# Patient Record
Sex: Female | Born: 1950 | Race: White | Hispanic: No | Marital: Married | Smoking: Former smoker
Health system: Southern US, Community
[De-identification: ages and names within clinical notes are randomized; demographics above are authoritative.]

## PROBLEM LIST (undated history)

## (undated) DIAGNOSIS — E785 Hyperlipidemia, unspecified: Secondary | ICD-10-CM

## (undated) DIAGNOSIS — F419 Anxiety disorder, unspecified: Secondary | ICD-10-CM

## (undated) DIAGNOSIS — C4490 Unspecified malignant neoplasm of skin, unspecified: Secondary | ICD-10-CM

## (undated) HISTORY — DX: Anxiety disorder, unspecified: F41.9

## (undated) HISTORY — DX: Hyperlipidemia, unspecified: E78.5

## (undated) HISTORY — PX: BREAST BIOPSY: SHX20

## (undated) HISTORY — DX: Unspecified malignant neoplasm of skin, unspecified: C44.90

## (undated) HISTORY — PX: BREAST EXCISIONAL BIOPSY: SUR124

---

## 1998-01-21 ENCOUNTER — Other Ambulatory Visit: Admission: RE | Admit: 1998-01-21 | Discharge: 1998-01-21 | Payer: Self-pay | Admitting: Obstetrics and Gynecology

## 2000-12-20 ENCOUNTER — Other Ambulatory Visit: Admission: RE | Admit: 2000-12-20 | Discharge: 2000-12-20 | Payer: Self-pay | Admitting: Obstetrics and Gynecology

## 2003-02-28 ENCOUNTER — Other Ambulatory Visit: Admission: RE | Admit: 2003-02-28 | Discharge: 2003-02-28 | Payer: Self-pay | Admitting: Obstetrics and Gynecology

## 2004-06-10 ENCOUNTER — Other Ambulatory Visit: Admission: RE | Admit: 2004-06-10 | Discharge: 2004-06-10 | Payer: Self-pay | Admitting: Obstetrics and Gynecology

## 2004-09-23 ENCOUNTER — Encounter: Admission: RE | Admit: 2004-09-23 | Discharge: 2004-09-23 | Payer: Self-pay | Admitting: Obstetrics and Gynecology

## 2004-10-11 ENCOUNTER — Emergency Department (HOSPITAL_COMMUNITY): Admission: EM | Admit: 2004-10-11 | Discharge: 2004-10-11 | Payer: Self-pay | Admitting: Emergency Medicine

## 2005-06-24 ENCOUNTER — Other Ambulatory Visit: Admission: RE | Admit: 2005-06-24 | Discharge: 2005-06-24 | Payer: Self-pay | Admitting: Obstetrics and Gynecology

## 2007-05-17 HISTORY — PX: COLONOSCOPY: SHX174

## 2008-06-16 LAB — CONVERTED CEMR LAB

## 2009-01-27 ENCOUNTER — Ambulatory Visit: Payer: Self-pay | Admitting: Family Medicine

## 2009-01-27 DIAGNOSIS — F411 Generalized anxiety disorder: Secondary | ICD-10-CM | POA: Insufficient documentation

## 2009-01-29 LAB — CONVERTED CEMR LAB
ALT: 21 units/L (ref 0–35)
AST: 21 units/L (ref 0–37)
Cholesterol: 218 mg/dL — ABNORMAL HIGH (ref 0–200)
Direct LDL: 143.2 mg/dL
HDL: 66.3 mg/dL (ref >39.00)
Total CHOL/HDL Ratio: 3
Triglycerides: 92 mg/dL (ref 0.0–149.0)
VLDL: 18.4 mg/dL (ref 0.0–40.0)

## 2009-03-02 ENCOUNTER — Ambulatory Visit: Payer: Self-pay | Admitting: Psychology

## 2009-08-25 ENCOUNTER — Other Ambulatory Visit: Admission: RE | Admit: 2009-08-25 | Discharge: 2009-08-25 | Payer: Self-pay | Admitting: Family Medicine

## 2009-08-25 ENCOUNTER — Ambulatory Visit: Payer: Self-pay | Admitting: Family Medicine

## 2009-08-25 DIAGNOSIS — E785 Hyperlipidemia, unspecified: Secondary | ICD-10-CM | POA: Insufficient documentation

## 2009-08-26 ENCOUNTER — Encounter: Admission: RE | Admit: 2009-08-26 | Discharge: 2009-08-26 | Payer: Self-pay | Admitting: Family Medicine

## 2009-08-26 LAB — HM MAMMOGRAPHY

## 2009-08-31 ENCOUNTER — Encounter (INDEPENDENT_AMBULATORY_CARE_PROVIDER_SITE_OTHER): Payer: Self-pay | Admitting: *Deleted

## 2009-09-03 ENCOUNTER — Encounter (INDEPENDENT_AMBULATORY_CARE_PROVIDER_SITE_OTHER): Payer: Self-pay | Admitting: *Deleted

## 2009-09-03 LAB — CONVERTED CEMR LAB: Pap Smear: NEGATIVE

## 2009-10-26 ENCOUNTER — Ambulatory Visit: Payer: Self-pay | Admitting: Family Medicine

## 2009-10-27 LAB — CONVERTED CEMR LAB
ALT: 20 units/L (ref 0–35)
AST: 21 units/L (ref 0–37)
Albumin: 4 g/dL (ref 3.5–5.2)
Alkaline Phosphatase: 54 units/L (ref 39–117)
BUN: 18 mg/dL (ref 6–23)
Basophils Absolute: 0.1 10*3/uL (ref 0.0–0.1)
Basophils Relative: 1 % (ref 0.0–3.0)
Bilirubin, Direct: 0.1 mg/dL (ref 0.0–0.3)
CO2: 30 meq/L (ref 19–32)
Calcium: 9.1 mg/dL (ref 8.4–10.5)
Chloride: 107 meq/L (ref 96–112)
Cholesterol: 204 mg/dL — ABNORMAL HIGH (ref 0–200)
Creatinine, Ser: 1 mg/dL (ref 0.4–1.2)
Direct LDL: 119.6 mg/dL
Eosinophils Absolute: 0.1 10*3/uL (ref 0.0–0.7)
Eosinophils Relative: 2.3 % (ref 0.0–5.0)
GFR calc non Af Amer: 61.81 mL/min (ref >60)
Glucose, Bld: 86 mg/dL (ref 70–99)
HCT: 38.8 % (ref 36.0–46.0)
HDL: 75.1 mg/dL (ref >39.00)
Hemoglobin: 13.6 g/dL (ref 12.0–15.0)
Lymphocytes Relative: 27.3 % (ref 12.0–46.0)
Lymphs Abs: 1.4 10*3/uL (ref 0.7–4.0)
MCHC: 34.9 g/dL (ref 30.0–36.0)
MCV: 93.8 fL (ref 78.0–100.0)
Monocytes Absolute: 0.4 10*3/uL (ref 0.1–1.0)
Monocytes Relative: 8 % (ref 3.0–12.0)
Neutro Abs: 3.1 10*3/uL (ref 1.4–7.7)
Neutrophils Relative %: 61.4 % (ref 43.0–77.0)
Platelets: 234 10*3/uL (ref 150.0–400.0)
Potassium: 4.6 meq/L (ref 3.5–5.1)
RBC: 4.14 M/uL (ref 3.87–5.11)
RDW: 13.2 % (ref 11.5–14.6)
Sodium: 143 meq/L (ref 135–145)
TSH: 3.61 microintl units/mL (ref 0.35–5.50)
Total Bilirubin: 0.6 mg/dL (ref 0.3–1.2)
Total CHOL/HDL Ratio: 3
Total Protein: 6.4 g/dL (ref 6.0–8.3)
Triglycerides: 70 mg/dL (ref 0.0–149.0)
VLDL: 14 mg/dL (ref 0.0–40.0)
WBC: 5.1 10*3/uL (ref 4.5–10.5)

## 2010-06-17 NOTE — Letter (Signed)
Summary: Results Follow up Letter  [place] at [facility]  [address] [place], [place] [address]   Phone: [phone]  Fax: [phone]    09/03/2009 MRN: [id number]    Caitlin Perez [address] [place], [place]  [address]    Dear Ms. Buswell,  The following are the results of your recent test(s):  Test         Result    Pap Smear:        Normal ___X__  Not Normal _____ Comments: ______________________________________________________ Cholesterol: LDL(Bad cholesterol):         Your goal is less than:         HDL (Good cholesterol):       Your goal is more than: Comments:  ______________________________________________________ Mammogram:        Normal _____  Not Normal _____ Comments:  ___________________________________________________________________ Hemoccult:        Normal _____  Not normal _______ Comments:    _____________________________________________________________________ Other Tests:    We routinely do not discuss normal results over the telephone.  If you desire a copy of the results, or you have any questions about this information we can discuss them at your next office visit.   Sincerely,    Marne A. Milinda Antis, M.D.  MAT:lsf

## 2010-06-17 NOTE — Assessment & Plan Note (Signed)
Summary: NEW PT TO EST/CLE   Vital Signs:  Patient profile:   60 year old female Height:      67.5 inches Weight:      168.50 pounds BMI:     26.10 Temp:     97.9 degrees F oral Pulse rate:   72 / minute Pulse rhythm:   regular BP sitting:   124 / 80  (left arm) Cuff size:   regular  Vitals Entered By: Liane Comber CMA Duncan Dull) (January 27, 2009 11:10 AM)  History of Present Illness: here to establish has always gone to gyn  has not had a primary care Dr - and wants to get est  is very healthy overall   is healthy eater -- very balanced  does exercise -- about 5 hours per week - walking and kayak  remote former smoker- no lung disease   is due for mammogram- wants to check that  fibrocystic - not as bad with age - does have mod caffiene intake  is interested in screening cholesterol  last checked several years ago - was ok  has not eaten today  does eat a fair amt of hamburger    is on paxil --for anxiety  " I was born anxious" all her life - low dose paxil really helps   had heel dexa in past - was good  takes ca and vit D -- one pill per day    Preventive Screening-Counseling & Management  Alcohol-Tobacco     Smoking Status: quit  Caffeine-Diet-Exercise     Does Patient Exercise: yes      Drug Use:  no.    Allergies (verified): 1)  ! Codeine  Past History:  Past Medical History: anxiety  gyn- Dr Rana Snare   Past Surgical History: colonoscopy 2009 2005 - heel bone density nl   Family History: Family History Ovarian cancer (other relative)- Maunt  Family History Heart disease (grandparent, other relative) family History Stroke (grandparent) Family History Brain Ca (parent)- smoker  grandparents - heart dz  sister with depression/ anx   Social History: Occupation: Airline pilot, Secretary/administrator firm for 14 years  Former Smoker-- quit 20 years ago (smoked for about 20year) Alcohol use-yes-- 4 drinks a week  Drug use-no Regular  exercise-yes moderate caffiene  Occupation:  employed Smoking Status:  quit Drug Use:  no Does Patient Exercise:  yes  Physical Exam  General:  Well-developed,well-nourished,in no acute distress; alert,appropriate and cooperative throughout examination Head:  normocephalic, atraumatic, and no abnormalities observed.   Eyes:  vision grossly intact, pupils equal, pupils round, and pupils reactive to light.  no conjunctival pallor, injection or icterus  Ears:  R ear normal and L ear normal.   Nose:  no nasal discharge.   Mouth:  pharynx pink and moist.   Neck:  supple with full rom and no masses or thyromegally, no JVD or carotid bruit  Lungs:  Normal respiratory effort, chest expands symmetrically. Lungs are clear to auscultation, no crackles or wheezes. Heart:  Normal rate and regular rhythm. S1 and S2 normal without gallop, murmur, click, rub or other extra sounds. Abdomen:  soft, non-tender, normal bowel sounds, no distention, and no masses.  no renal bruits  Msk:  No deformity or scoliosis noted of thoracic or lumbar spine.   Pulses:  R and L carotid,radial,femoral,dorsalis pedis and posterior tibial pulses are full and equal bilaterally Extremities:  No clubbing, cyanosis, edema, or deformity noted with normal full range of motion of all joints.  Neurologic:  sensation intact to light touch, gait normal, and DTRs symmetrical and normal.   Skin:  Intact without suspicious lesions or rashes Cervical Nodes:  No lymphadenopathy noted Psych:  normal affect, talkative and pleasant    Impression & Recommendations:  Problem # 1:  GENERALIZED ANXIETY DISORDER (ICD-300.02) Assessment New doing well on paxil will continue this dose  Her updated medication list for this problem includes:    Paxil 20 Mg Tabs (Paroxetine hcl) .Marland Kitchen... 1/2 by mouth once daily  Problem # 2:  SCREENING FOR LIPOID DISORDERS (ICD-V77.91) Assessment: New lab today disc lowering sat fats in diet   Orders:  Venipuncture ([address]) TLB-Lipid Panel (80061-LIPID) TLB-ALT (SGPT) (84460-ALT) TLB-AST (SGOT) (84450-SGOT)  Problem # 3:  Preventive Health Care (ICD-V70.0) Assessment: Comment Only f/u march for PE  Complete Medication List: 1)  Paxil 20 Mg Tabs (Paroxetine hcl) .... 1/2 by mouth once daily 2)  Calcium-vitamin D 600-125 Mg-unit Tabs (Calcium-vitamin d) .... By mouth daily  Patient Instructions: 1)  the current recommendation for calcium intake is 1200-1500 mg daily with 1000 IU of vitamin D 2)  you can raise your HDL (good cholesterol) by increasing exercise and eating omega 3 fatty acid supplement like fish oil or flax seed oil over the counter 3)  you can lower LDL (bad cholesterol) by limiting saturated fats in diet like red meat, fried foods, egg yolks, fatty breakfast meats, high fat dairy products and shellfish  4)  schedule follow up for PE in march   Prior Medications (reviewed today): CALCIUM-VITAMIN D 600-125 MG-UNIT TABS (CALCIUM-VITAMIN D) by mouth daily Current Allergies (reviewed today): ! CODEINE Current Medications (including changes made in today's visit):  PAXIL 20 MG TABS (PAROXETINE HCL) 1/2 by mouth once daily CALCIUM-VITAMIN D 600-125 MG-UNIT TABS (CALCIUM-VITAMIN D) by mouth daily

## 2010-06-17 NOTE — Letter (Signed)
Summary: Dr.Alixis Harmon,[facility]  Dr.Kay Shippy,[facility]   Imported By: Beau Fanny 01/27/2009 16:20:05  _____________________________________________________________________  External Attachment:    Type:   Image     Comment:   External Document

## 2010-06-17 NOTE — Letter (Signed)
Summary: Results Follow up Letter  [place] at [facility]  [address] [place], [place] [address]   Phone: [phone]  Fax: [phone]    08/31/2009 MRN: [id number]     Caitlin Perez [address] [place], [place]  [address]    Dear Ms. Ramberg,  The following are the results of your recent test(s):  Test         Result    Pap Smear:        Normal _____  Not Normal _____ Comments: ______________________________________________________ Cholesterol: LDL(Bad cholesterol):         Your goal is less than:         HDL (Good cholesterol):       Your goal is more than: Comments:  ______________________________________________________ Mammogram:        Normal ___x__  Not Normal _____ Comments:Repeat in 1 year  ___________________________________________________________________ Hemoccult:        Normal _____  Not normal _______ Comments:    _____________________________________________________________________ Other Tests:    We routinely do not discuss normal results over the telephone.  If you desire a copy of the results, or you have any questions about this information we can discuss them at your next office visit.   Sincerely,  Roxy Manns MD

## 2010-06-17 NOTE — Assessment & Plan Note (Signed)
Summary: CPX / LFW  R/S FROM 08/31/09   Vital Signs:  Patient profile:   60 year old female Height:      67.5 inches Weight:      168.25 pounds BMI:     26.06 Temp:     98 degrees F oral Pulse rate:   76 / minute Pulse rhythm:   regular BP sitting:   136 / 84  (left arm) Cuff size:   regular  Vitals Entered By: Lewanda Rife LPN (August 25, 2009 9:39 AM)  Serial Vital Signs/Assessments:  Time      Position  BP       Pulse  Resp  Temp     By                     129/82                         Judith Part MD  CC: complete physical with pap and breast exam LMP 25 yrs ago   History of Present Illness: here for health mt exam with  gyn care  is feeling fine in general  tax season is almost over- very stressful then will take a 3 day trip to beach    wt is stable   bp 136/84 on first check today- a little high for her -- thinks this is situational  gave blood not long ago -- did well with that   colonosc 09 -- no polyps- 10 year f/u   heel dexa nl in 05  lipids this fall a bit high HDL 66 and LDL 143  asked to work on diet   no fam hx of OP  is taking ca and vit D   pap 2/10--no abn paps or problems  no bleeding  Dr Rana Snare was her gyn  mam 2/09-- needs to get that scheduled  self exam -- no lumps or changes   TD 07    Allergies: 1)  ! Codeine  Past History:  Past Medical History: Last updated: 01/27/2009 anxiety  gyn- Dr Rana Snare   Past Surgical History: Last updated: 01/27/2009 colonoscopy 2009 2005 - heel bone density nl   Family History: Last updated: 01/27/2009 Family History Ovarian cancer (other relative)- Maunt  Family History Heart disease (grandparent, other relative) family History Stroke (grandparent) Family History Brain Ca (parent)- smoker  grandparents - heart dz  sister with depression/ anx   Social History: Last updated: 01/27/2009 Occupation: Airline pilot, [employer] for 14 years  Former Smoker-- quit 20 years ago  (smoked for about 20year) Alcohol use-yes-- 4 drinks a week  Drug use-no Regular exercise-yes moderate caffiene   Risk Factors: Exercise: yes (01/27/2009)  Risk Factors: Smoking Status: quit (01/27/2009)  Review of Systems General:  Denies fatigue, loss of appetite, and malaise. Eyes:  Denies blurring and eye irritation. CV:  Denies chest pain or discomfort, lightheadness, and palpitations. Resp:  Denies cough and wheezing. GI:  Denies abdominal pain, change in bowel habits, and indigestion. GU:  Denies abnormal vaginal bleeding, discharge, and hematuria. MS:  Denies joint pain, joint redness, and joint swelling. Derm:  Denies itching, lesion(s), poor wound healing, and rash. Neuro:  Denies numbness and tingling. Psych:  Denies anxiety and depression. Endo:  Denies cold intolerance, excessive thirst, excessive urination, and heat intolerance. Heme:  Denies abnormal bruising and bleeding.  Physical Exam  General:  Well-developed,well-nourished,in no acute distress; alert,appropriate and cooperative throughout examination Head:  normocephalic, atraumatic, and no abnormalities observed.   Eyes:  vision grossly intact, pupils equal, pupils round, and pupils reactive to light.  no conjunctival pallor, injection or icterus  Ears:  R ear normal and L ear normal.   Nose:  no nasal discharge.   Mouth:  pharynx pink and moist.   Neck:  supple with full rom and no masses or thyromegally, no JVD or carotid bruit  Chest Wall:  No deformities, masses, or tenderness noted. Breasts:  No mass, nodules, thickening, tenderness, bulging, retraction, inflamation, nipple discharge or skin changes noted.   Lungs:  Normal respiratory effort, chest expands symmetrically. Lungs are clear to auscultation, no crackles or wheezes. Heart:  Normal rate and regular rhythm. S1 and S2 normal without gallop, murmur, click, rub or other extra sounds. Abdomen:  Bowel sounds positive,abdomen soft and non-tender  without masses, organomegaly or hernias noted. no renal bruits  Genitalia:  small/ tight introitus with some pain on spec exam  difficult to get good cervical sample due to this  no external lesions, no vaginal discharge, mucosa pink and moist, no vaginal or cervical lesions, no vaginal atrophy, and no friaility or hemorrhage.   Msk:  No deformity or scoliosis noted of thoracic or lumbar spine.  no acute joint changes  Pulses:  R and L carotid,radial,femoral,dorsalis pedis and posterior tibial pulses are full and equal bilaterally Extremities:  No clubbing, cyanosis, edema, or deformity noted with normal full range of motion of all joints.   Neurologic:  sensation intact to light touch, gait normal, and DTRs symmetrical and normal.   Skin:  Intact without suspicious lesions or rashes lentigos diffusely  Cervical Nodes:  No lymphadenopathy noted Axillary Nodes:  No palpable lymphadenopathy Inguinal Nodes:  No significant adenopathy Psych:  normal affect, talkative and pleasant    Impression & Recommendations:  Problem # 1:  HEALTH MAINTENANCE EXAM (ICD-V70.0) Assessment Comment Only reviewed health habits including diet, exercise and skin cancer prevention reviewed health maintenance list and family history reminded to work on chol sched fasting lab in 1-2 mo  rev req for ca and D and exercise for bone health will consider dexa at 65 - no addn risk factors   Problem # 2:  ROUTINE GYNECOLOGICAL EXAMINATION (ICD-V72.31) Assessment: Comment Only annual exam with pap done  somewhat difficult exam with small introitus and some discomfort from that- pt states is baseline  Problem # 3:  OTHER SCREENING MAMMOGRAM (ICD-V76.12) Assessment: Comment Only annual mammogram scheduled adv pt to continue regular self breast exams non remarkable breast exam today  Orders: Radiology Referral (Radiology)  Problem # 4:  HYPERLIPIDEMIA (ICD-272.4) Assessment: New  rev last labs admittedly - pt  has not changed diet at all and wants some more time before re check  will re check 2-3 mo  rev low sat fat diet in detail also urged to get back to exercise   Labs Reviewed: SGOT: 21 (01/27/2009)   SGPT: 21 (01/27/2009)   HDL:66.30 (01/27/2009)  Chol:218 (01/27/2009)  Trig:92.0 (01/27/2009)  Problem # 5:  GENERALIZED ANXIETY DISORDER (ICD-300.02) Assessment: Unchanged doing well with paxil- no probems  handling situational stress well  paxil refilled and sent to pharmacy Her updated medication list for this problem includes:    Paxil 20 Mg Tabs (Paroxetine hcl) .Marland Kitchen... 1/2 by mouth once daily  Complete Medication List: 1)  Paxil 20 Mg Tabs (Paroxetine hcl) .... 1/2 by mouth once daily 2)  Calcium-vitamin D 600-125 Mg-unit Tabs (Calcium-vitamin d) .... By mouth  daily  Patient Instructions: 1)  the current recommendation for calcium intake is 1200-1500 mg daily with 1000 IU of vitamin D  2)  you can raise your HDL (good cholesterol) by increasing exercise and eating omega 3 fatty acid supplement like fish oil or flax seed oil over the counter 3)  you can lower LDL (bad cholesterol) by limiting saturated fats in diet like red meat, fried foods, egg yolks, fatty breakfast meats, high fat dairy products and shellfish  4)  schedule fasting labs in 1-2 months wellness/ lipids 272, v70.0 5)  keep working on low fat diet 6)  we will schedule mammogram at check out  Prescriptions: PAXIL 20 MG TABS (PAROXETINE HCL) 1/2 by mouth once daily  #15 x 11   Entered and Authorized by:   Judith Part MD   Signed by:   Judith Part MD on 08/25/2009   Method used:   Electronically to        [id number] (retail)       [address] [place], [place]  [address]       Ph: [phone]       Fax: [phone]   RxID:   [id number]   Current Allergies (reviewed today): ! CODEINE

## 2010-08-26 ENCOUNTER — Other Ambulatory Visit: Payer: Self-pay | Admitting: *Deleted

## 2010-08-26 MED ORDER — PAROXETINE HCL 20 MG PO TABS: ORAL_TABLET | ORAL | Status: DC

## 2010-11-08 ENCOUNTER — Other Ambulatory Visit: Payer: Self-pay | Admitting: Family Medicine

## 2011-01-12 ENCOUNTER — Other Ambulatory Visit: Payer: Self-pay | Admitting: Family Medicine

## 2011-01-12 NOTE — Telephone Encounter (Signed)
Left message with pt's husband for pt to call back tomorrow.

## 2011-01-12 NOTE — Telephone Encounter (Signed)
[facility] electronically request refill Paxil 20mg . Pt last seen 08/25/09. Unable to reach pt by phone to schedule f/u appt.Please advise.

## 2011-01-12 NOTE — Telephone Encounter (Signed)
I will refil once electronically - but no further until appt is made Send a letter please since you cannot reach her -thanks

## 2011-01-14 ENCOUNTER — Encounter: Payer: Self-pay | Admitting: Family Medicine

## 2011-01-14 NOTE — Telephone Encounter (Signed)
Patient notified as instructed by telephone. Pt scheduled appt with Dr Milinda Antis 01/18/11 at 8:00am.

## 2011-01-18 ENCOUNTER — Ambulatory Visit (INDEPENDENT_AMBULATORY_CARE_PROVIDER_SITE_OTHER): Payer: Self-pay | Admitting: Family Medicine

## 2011-01-18 ENCOUNTER — Encounter: Payer: Self-pay | Admitting: Family Medicine

## 2011-01-18 DIAGNOSIS — F411 Generalized anxiety disorder: Secondary | ICD-10-CM

## 2011-01-18 DIAGNOSIS — E785 Hyperlipidemia, unspecified: Secondary | ICD-10-CM

## 2011-01-18 MED ORDER — PAROXETINE HCL 20 MG PO TABS: 10.0000 mg | ORAL_TABLET | ORAL | Status: DC

## 2011-01-18 NOTE — Assessment & Plan Note (Signed)
This was improved last year with health diet and exercise  Is doing well with this overall and does not want to check this year Rev last labs with her  Rev low sat fat diet

## 2011-01-18 NOTE — Progress Notes (Signed)
Subjective:    Patient ID: Caitlin Perez, female    DOB: Sep 17, 1950, 60 y.o.   MRN: [id number]  HPI Here for f/u of chronic med problems incl anxiety and hyperlipidemia  Is doing well overall but has a little cold or allergies Just came back from [place]  Started with sore throat  Nasal d/c is clear - watery   Will try antihistamine  Wt is down 2 lb- working out and watching what she eats  Lost 4 lb by her scale   Last lipids were fair Lab Results  Component Value Date   CHOL 204* 10/26/2009   CHOL 218* 01/27/2009   Lab Results  Component Value Date   HDL 75.10 10/26/2009   HDL 65.78 01/27/2009   No results found for this basename: [facility]   Lab Results  Component Value Date   TRIG 70.0 10/26/2009   TRIG 92.0 01/27/2009   Lab Results  Component Value Date   CHOLHDL 3 10/26/2009   CHOLHDL 3 01/27/2009   Lab Results  Component Value Date   LDLDIRECT 119.6 10/26/2009   LDLDIRECT 143.2 01/27/2009   Diet - is much better - and does not want to check it due to improvement with it  No sweets and no red meat   On paxil for anxiety- is doing great and is excited about that  Has missed some doses - out of town  Can feel a bit dizzy and jittery off of it  Takes 1/2 pill daily for total dose of 10 mg and this works well for her  No depressive symptoms This helps keep her level in terms of anxiety and stress especially during tax season   Patient Active Problem List  Diagnoses  . HYPERLIPIDEMIA  . GENERALIZED ANXIETY DISORDER   Past Medical History  Diagnosis Date  . Anxiety   . Other and unspecified hyperlipidemia    Past Surgical History  Procedure Date  . Colonoscopy 2009   History  Substance Use Topics  . Smoking status: Former Games developer  . Smokeless tobacco: Not on file  . Alcohol Use: Yes     4 drinks/week   Family History  Problem Relation Age of Onset  . Ovarian cancer Maternal Aunt   . Heart disease      GP  . Stroke      GP  . Brain cancer     Parent  +smoker  . Depression Sister   . Anxiety disorder Sister    Allergies  Allergen Reactions  . Codeine    Current Outpatient Prescriptions on File Prior to Visit  Medication Sig Dispense Refill  . Calcium-Vitamin D 600-125 MG-UNIT TABS Take 1 tablet by mouth daily.              Review of Systems Review of Systems  Constitutional: Negative for fever, appetite change, fatigue and unexpected weight change.  Eyes: Negative for pain and visual disturbance.  ENT pos for runny nose, no facial pain or ear pain  Respiratory: Negative for cough and shortness of breath.   Cardiovascular: Negative for cp or palpitations    Gastrointestinal: Negative for nausea, diarrhea and constipation.  Genitourinary: Negative for urgency and frequency.  Skin: Negative for pallor or rash   Neurological: Negative for weakness, light-headedness, numbness and headaches.  Hematological: Negative for adenopathy. Does not bruise/bleed easily.  Psychiatric/Behavioral: Negative for dysphoric mood. Anxiety is improved         Objective:   Physical Exam  Constitutional: She appears well-developed and  well-nourished. No distress.  HENT:  Head: Normocephalic and atraumatic.  Mouth/Throat: Oropharynx is clear and moist.  Eyes: Conjunctivae and EOM are normal. Pupils are equal, round, and reactive to light.  Neck: Normal range of motion. Neck supple. No JVD present. No thyromegaly present.  Cardiovascular: Normal rate, regular rhythm, normal heart sounds and intact distal pulses.   Pulmonary/Chest: Effort normal and breath sounds normal. No respiratory distress. She has no wheezes.  Abdominal: Soft. Bowel sounds are normal.  Musculoskeletal: She exhibits no tenderness.  Lymphadenopathy:    She has no cervical adenopathy.  Neurological: She is alert. She has normal reflexes. No cranial nerve deficit. Coordination normal.  Skin: Skin is warm and dry. No rash noted. No erythema. No pallor.  Psychiatric:  She has a normal mood and affect.          Assessment & Plan:

## 2011-01-18 NOTE — Patient Instructions (Signed)
I'm glad you are doing well Keep up the great diet and exercise  No change in paxil- I sent it to your pharmacy

## 2011-01-18 NOTE — Assessment & Plan Note (Signed)
Overall doing well with low dose paxil- no problems  Refilled this for the year Commended on good diet and exercise and coping skills Will update if any problems

## 2011-02-19 ENCOUNTER — Other Ambulatory Visit: Payer: Self-pay | Admitting: Family Medicine

## 2012-01-13 ENCOUNTER — Ambulatory Visit: Payer: BC Managed Care – PPO | Admitting: Family Medicine

## 2012-02-06 ENCOUNTER — Other Ambulatory Visit: Payer: Self-pay | Admitting: *Deleted

## 2012-02-06 NOTE — Telephone Encounter (Signed)
Please call and make a fall or winter appt and can refil med until then thanks

## 2012-02-06 NOTE — Telephone Encounter (Signed)
Ok to refill? Last OV 01/18/11, no recent labs and no future appt.

## 2012-02-07 NOTE — Telephone Encounter (Signed)
Pt home # off so call work # and left pt a voicemail requesting her to call the office

## 2012-02-09 MED ORDER — PAROXETINE HCL 20 MG PO TABS: 10.0000 mg | ORAL_TABLET | ORAL | Status: DC

## 2012-02-09 NOTE — Telephone Encounter (Signed)
Go ahead and tell her to schedule fasting labs prior, thanks

## 2012-02-09 NOTE — Telephone Encounter (Signed)
Pt schedule f/u appt for 02/17/12 @ 8:45, pt wanted to know if she needs to be fasting for lab work or is it ok to eat before the appt?

## 2012-02-09 NOTE — Telephone Encounter (Signed)
Left voicemail letting pt know she needs to schedule a fasting lab appt. Before her appt. With D. T

## 2012-02-13 ENCOUNTER — Telehealth (INDEPENDENT_AMBULATORY_CARE_PROVIDER_SITE_OTHER): Payer: BC Managed Care – PPO | Admitting: Family Medicine

## 2012-02-13 DIAGNOSIS — E785 Hyperlipidemia, unspecified: Secondary | ICD-10-CM

## 2012-02-13 DIAGNOSIS — F411 Generalized anxiety disorder: Secondary | ICD-10-CM

## 2012-02-13 NOTE — Telephone Encounter (Signed)
Message copied by Judy Pimple on Mon Feb 13, 2012  9:55 PM ------      Message from: Alvina Chou      Created: Mon Feb 13, 2012 10:39 AM      Regarding: Lab orders for Tuesday 10.1.13       Labs for f/u appt, thanks, t

## 2012-02-14 ENCOUNTER — Other Ambulatory Visit (INDEPENDENT_AMBULATORY_CARE_PROVIDER_SITE_OTHER): Payer: BC Managed Care – PPO

## 2012-02-14 DIAGNOSIS — E785 Hyperlipidemia, unspecified: Secondary | ICD-10-CM

## 2012-02-14 DIAGNOSIS — F411 Generalized anxiety disorder: Secondary | ICD-10-CM

## 2012-02-14 LAB — LIPID PANEL
Cholesterol: 187 mg/dL (ref 0–200)
HDL: 70.8 mg/dL (ref >39.00)
LDL Cholesterol: 104 mg/dL — ABNORMAL HIGH (ref 0–99)
Total CHOL/HDL Ratio: 3
Triglycerides: 60 mg/dL (ref 0.0–149.0)
VLDL: 12 mg/dL (ref 0.0–40.0)

## 2012-02-14 LAB — COMPREHENSIVE METABOLIC PANEL
ALT: 22 U/L (ref 0–35)
AST: 21 U/L (ref 0–37)
Albumin: 3.9 g/dL (ref 3.5–5.2)
Alkaline Phosphatase: 51 U/L (ref 39–117)
BUN: 16 mg/dL (ref 6–23)
CO2: 28 mEq/L (ref 19–32)
Calcium: 9.1 mg/dL (ref 8.4–10.5)
Chloride: 104 mEq/L (ref 96–112)
Creatinine, Ser: 1 mg/dL (ref 0.4–1.2)
GFR: 57.27 mL/min — ABNORMAL LOW (ref >60.00)
Glucose, Bld: 90 mg/dL (ref 70–99)
Potassium: 4.1 mEq/L (ref 3.5–5.1)
Sodium: 138 mEq/L (ref 135–145)
Total Bilirubin: 0.9 mg/dL (ref 0.3–1.2)
Total Protein: 6.6 g/dL (ref 6.0–8.3)

## 2012-02-14 LAB — TSH: TSH: 3.7 u[IU]/mL (ref 0.35–5.50)

## 2012-02-17 ENCOUNTER — Ambulatory Visit: Payer: BC Managed Care – PPO | Admitting: Family Medicine

## 2012-02-28 ENCOUNTER — Encounter: Payer: Self-pay | Admitting: Family Medicine

## 2012-02-28 ENCOUNTER — Ambulatory Visit (INDEPENDENT_AMBULATORY_CARE_PROVIDER_SITE_OTHER): Payer: BC Managed Care – PPO | Admitting: Family Medicine

## 2012-02-28 VITALS — BP 125/80 | HR 68 | Temp 98.5°F | Ht 68.0 in | Wt 168.0 lb

## 2012-02-28 DIAGNOSIS — M25562 Pain in left knee: Secondary | ICD-10-CM | POA: Insufficient documentation

## 2012-02-28 DIAGNOSIS — M25569 Pain in unspecified knee: Secondary | ICD-10-CM

## 2012-02-28 DIAGNOSIS — F411 Generalized anxiety disorder: Secondary | ICD-10-CM

## 2012-02-28 MED ORDER — PAROXETINE HCL 20 MG PO TABS: 10.0000 mg | ORAL_TABLET | ORAL | Status: DC

## 2012-02-28 NOTE — Progress Notes (Signed)
Subjective:    Patient ID: Caitlin Perez, female    DOB: 1951-01-26, 61 y.o.   MRN: [id number]  HPI Here for f/u of chronic condition- anxiety disorder   Also she hurt her knee last week - is training for a 5 K  L knee- is a reoccuring problem  Has happened about 6 times  Was running on the beach- uneven surface  Felt a twinge - and then hurt worse after  Even had trouble as a kid -- tore a ligament while sledding - no surgery  Went to [facility] doctor - several years ago  Tried some ice last night  Is using nsaid at home  Is is improved from last week  Never swelled  Wears a brace   Anxiety is very well controlled , and less stress lately  On paxil for 1 years - plans to stay on it No side effects  Mood is good  A lot of anxiety in her family   Wt is up 2 lb with bmi of 21   Does not get flu shots  She declines it here or at work  [employer] give me a good reason why   bp is up a bit today   Patient Active Problem List  Diagnosis  . HYPERLIPIDEMIA  . GENERALIZED ANXIETY DISORDER  . Knee pain, left   Past Medical History  Diagnosis Date  . Anxiety   . Other and unspecified hyperlipidemia    Past Surgical History  Procedure Date  . Colonoscopy 2009   History  Substance Use Topics  . Smoking status: Former Games developer  . Smokeless tobacco: Not on file  . Alcohol Use: Yes     4 drinks/week   Family History  Problem Relation Age of Onset  . Ovarian cancer Maternal Aunt   . Heart disease      GP  . Stroke      GP  . Brain cancer      Parent  +smoker  . Depression Sister   . Anxiety disorder Sister    Allergies  Allergen Reactions  . Codeine    Current Outpatient Prescriptions on File Prior to Visit  Medication Sig Dispense Refill  . Calcium-Vitamin D 600-125 MG-UNIT TABS Take 1 tablet by mouth daily.        Marland Kitchen PARoxetine (PAXIL) 20 MG tablet Take 0.5 tablets (10 mg total) by mouth every morning.  15 tablet  11         Review of Systems   Review  of Systems  Constitutional: Negative for fever, appetite change, fatigue and unexpected weight change.  Eyes: Negative for pain and visual disturbance.  Respiratory: Negative for cough and shortness of breath.   Cardiovascular: Negative for cp or palpitations    Gastrointestinal: Negative for nausea, diarrhea and constipation.  Genitourinary: Negative for urgency and frequency.  Skin: Negative for pallor or rash   MSK pos for knee pain  Neurological: Negative for weakness, light-headedness, numbness and headaches.  Hematological: Negative for adenopathy. Does not bruise/bleed easily.  Psychiatric/Behavioral: Negative for dysphoric mood. The patient has anxiety that is fairly controlled     Objective:   Physical Exam  Constitutional: She appears well-developed and well-nourished. No distress.  HENT:  Head: Normocephalic and atraumatic.  Mouth/Throat: Oropharynx is clear and moist.  Eyes: Conjunctivae normal and EOM are normal. Pupils are equal, round, and reactive to light. No scleral icterus.  Neck: Normal range of motion. Neck supple. No JVD present. Carotid bruit is not  present. No thyromegaly present.  Cardiovascular: Normal rate, regular rhythm, normal heart sounds and intact distal pulses.   Pulmonary/Chest: Effort normal and breath sounds normal. No respiratory distress. She has no wheezes.  Abdominal: Soft. Bowel sounds are normal. She exhibits no abdominal bruit.  Musculoskeletal: She exhibits tenderness. She exhibits no edema.       Left knee: She exhibits abnormal patellar mobility. She exhibits normal range of motion, no swelling, no effusion, no ecchymosis, no deformity, no erythema, normal alignment, no LCL laxity, no bony tenderness, normal meniscus and no MCL laxity. tenderness found. Patellar tendon tenderness noted.  Lymphadenopathy:    She has no cervical adenopathy.  Neurological: She is alert. She has normal reflexes.  Skin: Skin is warm and dry. No rash noted. No  erythema. No pallor.  Psychiatric: She has a normal mood and affect.          Assessment & Plan:

## 2012-02-28 NOTE — Patient Instructions (Addendum)
For knee- continue to ice for 10 minute intervals when you can - and elevate Wear brace when active  Consider lower impact exercise -- doing different things  Aleve or advil otc with food - is ok for 1-2 weeks If no further improvement- make appt with Dr Patsy Lager here  No change in paxil  I'm glad you are doing well

## 2012-02-28 NOTE — Assessment & Plan Note (Signed)
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

## 2012-02-28 NOTE — Assessment & Plan Note (Signed)
Doing well - no problems with paxil and has been unable to stop it in the past Will continue to refil Good coping skills and exercise  Pt declines health mt

## 2012-03-07 ENCOUNTER — Other Ambulatory Visit: Payer: Self-pay | Admitting: *Deleted

## 2012-10-09 ENCOUNTER — Other Ambulatory Visit: Payer: Self-pay | Admitting: Family Medicine

## 2012-10-09 MED ORDER — PAROXETINE HCL 20 MG PO TABS: 10.0000 mg | ORAL_TABLET | ORAL | Status: DC

## 2012-10-09 NOTE — Telephone Encounter (Signed)
Received fax from [facility] pharmacy requesting pt's paxil to be a 90 day supply, new Rx sent to pharmacy

## 2012-12-25 ENCOUNTER — Other Ambulatory Visit (HOSPITAL_COMMUNITY)
Admission: RE | Admit: 2012-12-25 | Discharge: 2012-12-25 | Disposition: A | Discharge status: Home / Self Care | Payer: BC Managed Care – PPO | Source: Ambulatory Visit | Attending: Family Medicine | Admitting: Family Medicine

## 2012-12-25 ENCOUNTER — Other Ambulatory Visit: Payer: Self-pay

## 2012-12-25 ENCOUNTER — Ambulatory Visit (INDEPENDENT_AMBULATORY_CARE_PROVIDER_SITE_OTHER): Payer: BC Managed Care – PPO | Admitting: Family Medicine

## 2012-12-25 ENCOUNTER — Encounter: Payer: Self-pay | Admitting: Family Medicine

## 2012-12-25 VITALS — BP 134/74 | HR 69 | Temp 98.4°F | Ht 67.75 in | Wt 166.5 lb

## 2012-12-25 DIAGNOSIS — Z01419 Encounter for gynecological examination (general) (routine) without abnormal findings: Secondary | ICD-10-CM

## 2012-12-25 DIAGNOSIS — Z1151 Encounter for screening for human papillomavirus (HPV): Secondary | ICD-10-CM | POA: Insufficient documentation

## 2012-12-25 DIAGNOSIS — R5383 Other fatigue: Secondary | ICD-10-CM

## 2012-12-25 DIAGNOSIS — R5381 Other malaise: Secondary | ICD-10-CM

## 2012-12-25 DIAGNOSIS — Z0000 Encounter for general adult medical examination without abnormal findings: Secondary | ICD-10-CM

## 2012-12-25 DIAGNOSIS — Z1231 Encounter for screening mammogram for malignant neoplasm of breast: Secondary | ICD-10-CM

## 2012-12-25 LAB — COMPREHENSIVE METABOLIC PANEL
ALT: 20 U/L (ref 0–35)
AST: 20 U/L (ref 0–37)
Albumin: 4.2 g/dL (ref 3.5–5.2)
Alkaline Phosphatase: 55 U/L (ref 39–117)
BUN: 17 mg/dL (ref 6–23)
CO2: 28 mEq/L (ref 19–32)
Calcium: 9.5 mg/dL (ref 8.4–10.5)
Chloride: 103 mEq/L (ref 96–112)
Creatinine, Ser: 1.1 mg/dL (ref 0.4–1.2)
GFR: 54.67 mL/min — ABNORMAL LOW (ref >60.00)
Glucose, Bld: 98 mg/dL (ref 70–99)
Potassium: 4.1 mEq/L (ref 3.5–5.1)
Sodium: 140 mEq/L (ref 135–145)
Total Bilirubin: 0.8 mg/dL (ref 0.3–1.2)
Total Protein: 7.3 g/dL (ref 6.0–8.3)

## 2012-12-25 LAB — TSH: TSH: 2.34 u[IU]/mL (ref 0.35–5.50)

## 2012-12-25 LAB — LIPID PANEL
Cholesterol: 199 mg/dL (ref 0–200)
HDL: 73.4 mg/dL (ref >39.00)
LDL Cholesterol: 109 mg/dL — ABNORMAL HIGH (ref 0–99)
Total CHOL/HDL Ratio: 3
Triglycerides: 84 mg/dL (ref 0.0–149.0)
VLDL: 16.8 mg/dL (ref 0.0–40.0)

## 2012-12-25 LAB — CBC WITH DIFFERENTIAL/PLATELET
Basophils Absolute: 0 10*3/uL (ref 0.0–0.1)
Basophils Relative: 0.6 % (ref 0.0–3.0)
Eosinophils Absolute: 0.1 10*3/uL (ref 0.0–0.7)
Eosinophils Relative: 1.5 % (ref 0.0–5.0)
HCT: 42.7 % (ref 36.0–46.0)
Hemoglobin: 14.4 g/dL (ref 12.0–15.0)
Lymphocytes Relative: 23 % (ref 12.0–46.0)
Lymphs Abs: 1.3 10*3/uL (ref 0.7–4.0)
MCHC: 33.7 g/dL (ref 30.0–36.0)
MCV: 94.8 fl (ref 78.0–100.0)
Monocytes Absolute: 0.4 10*3/uL (ref 0.1–1.0)
Monocytes Relative: 7.7 % (ref 3.0–12.0)
Neutro Abs: 3.7 10*3/uL (ref 1.4–7.7)
Neutrophils Relative %: 67.2 % (ref 43.0–77.0)
Platelets: 242 10*3/uL (ref 150.0–400.0)
RBC: 4.5 Mil/uL (ref 3.87–5.11)
RDW: 12.5 % (ref 11.5–14.6)
WBC: 5.5 10*3/uL (ref 4.5–10.5)

## 2012-12-25 LAB — VITAMIN B12: Vitamin B-12: 384 pg/mL (ref 211–911)

## 2012-12-25 MED ORDER — PAROXETINE HCL 20 MG PO TABS: 20.0000 mg | ORAL_TABLET | ORAL | Status: DC

## 2012-12-25 NOTE — Assessment & Plan Note (Signed)
Lab today along with wellness lab  May be due to lifestyle /schedule/ menopausal change

## 2012-12-25 NOTE — Progress Notes (Signed)
Subjective:    Patient ID: Caitlin Perez, female    DOB: 10/15/1950, 62 y.o.   MRN: [id number]  HPI Here for health maintenance exam and to review chronic medical problems    Is doing well overall - but very very fatigued  She tried some B12 for a while - otc -? Dose - may have helped a little bit  Sleeps a lot - and sleeps fairly well , she does snore a bit -- mother had sleep apnea  She does wake up fairly refreshed - with coffee   Some stress - feels it more than she used to  paxil - takes up to a whole pill daily now    Colon cancer screening - has had one at age 17 with a 10 year recall   Zoster status -never had vaccine and not interested   Mammogram 4/11 -has not had one since- will go to the breast center Nl self exam - no lumps   Pap 4/11-has not had one since No problems  Will do that today  Flu vaccine -does not get them   Td 1/07  Mood - fair-on paxil   Walks about an hour most days -does well with that   Patient Active Problem List   Diagnosis Date Noted  . Routine general medical examination at a health care facility 12/25/2012  . Knee pain, left 02/28/2012  . HYPERLIPIDEMIA 08/25/2009  . GENERALIZED ANXIETY DISORDER 01/27/2009   Past Medical History  Diagnosis Date  . Anxiety   . Other and unspecified hyperlipidemia    Past Surgical History  Procedure Laterality Date  . Colonoscopy  2009   History  Substance Use Topics  . Smoking status: Former Games developer  . Smokeless tobacco: Not on file     Comment: over 30 years ago  . Alcohol Use: Yes     Comment: 4 drinks/week   Family History  Problem Relation Age of Onset  . Ovarian cancer Maternal Aunt   . Heart disease      GP  . Stroke      GP  . Brain cancer      Parent  +smoker  . Depression Sister   . Anxiety disorder Sister    Allergies  Allergen Reactions  . Codeine    Current Outpatient Prescriptions on File Prior to Visit  Medication Sig Dispense Refill  . PARoxetine (PAXIL)  20 MG tablet Take 0.5 tablets (10 mg total) by mouth every morning.  45 tablet  1   No current facility-administered medications on file prior to visit.      Review of Systems Review of Systems  Constitutional: Negative for fever, appetite change,  and unexpected weight change. pos for fatigue Eyes: Negative for pain and visual disturbance.  Respiratory: Negative for cough and shortness of breath.   Cardiovascular: Negative for cp or palpitations    Gastrointestinal: Negative for nausea, diarrhea and constipation.  Genitourinary: Negative for urgency and frequency.  Skin: Negative for pallor or rash   Neurological: Negative for weakness, light-headedness, numbness and headaches.  Hematological: Negative for adenopathy. Does not bruise/bleed easily.  Psychiatric/Behavioral: Negative for dysphoric mood. The patient is not nervous/anxious.         Objective:   Physical Exam  Constitutional: She appears well-developed and well-nourished. No distress.  HENT:  Head: Normocephalic and atraumatic.  Right Ear: External ear normal.  Left Ear: External ear normal.  Nose: Nose normal.  Mouth/Throat: Oropharynx is clear and moist.  Eyes: Conjunctivae and  EOM are normal. Pupils are equal, round, and reactive to light. Right eye exhibits no discharge. Left eye exhibits no discharge. No scleral icterus.  Neck: Normal range of motion. Neck supple. No JVD present. Carotid bruit is not present. No thyromegaly present.  Cardiovascular: Normal rate, regular rhythm, normal heart sounds and intact distal pulses.  Exam reveals no gallop.   Pulmonary/Chest: Effort normal and breath sounds normal. No respiratory distress. She has no wheezes. She has no rales.  Abdominal: Soft. Bowel sounds are normal. She exhibits no distension, no abdominal bruit and no mass. There is no tenderness.  Genitourinary: Rectum normal, vagina normal and uterus normal. No breast swelling, tenderness, discharge or bleeding. There  is no rash, tenderness or lesion on the right labia. There is no rash, tenderness or lesion on the left labia. Uterus is not enlarged and not tender. Cervix exhibits no motion tenderness, no discharge and no friability. Right adnexum displays no mass, no tenderness and no fullness. Left adnexum displays no mass, no tenderness and no fullness. No bleeding around the vagina. No vaginal discharge found.  Very tight introitus  Breast exam: No mass, nodules, thickening, tenderness, bulging, retraction, inflamation, nipple discharge or skin changes noted.  No axillary or clavicular LA.  Chaperoned exam.    Musculoskeletal: She exhibits no edema and no tenderness.  Lymphadenopathy:    She has no cervical adenopathy.  Neurological: She is alert. She has normal reflexes. No cranial nerve deficit. She exhibits normal muscle tone. Coordination normal.  Skin: Skin is warm and dry. No rash noted. No erythema. No pallor.  Psychiatric: She has a normal mood and affect.          Assessment & Plan:

## 2012-12-25 NOTE — Assessment & Plan Note (Signed)
Reviewed health habits including diet and exercise and skin cancer prevention Also reviewed health mt list, fam hx and immunizations  Wellness labs today 

## 2012-12-25 NOTE — Assessment & Plan Note (Signed)
Routine exam with pap  No complaints or issues

## 2012-12-25 NOTE — Patient Instructions (Addendum)
Don't forget to schedule your mammogram  Pap today Labs today  Take care of yourself and keep walking

## 2012-12-27 ENCOUNTER — Encounter: Payer: Self-pay | Admitting: *Deleted

## 2012-12-31 ENCOUNTER — Encounter: Payer: Self-pay | Admitting: *Deleted

## 2013-01-08 ENCOUNTER — Ambulatory Visit
Admission: RE | Admit: 2013-01-08 | Discharge: 2013-01-08 | Disposition: A | Discharge status: Home / Self Care | Payer: BC Managed Care – PPO | Source: Ambulatory Visit

## 2013-01-08 DIAGNOSIS — Z1231 Encounter for screening mammogram for malignant neoplasm of breast: Secondary | ICD-10-CM

## 2013-01-09 ENCOUNTER — Encounter: Payer: Self-pay | Admitting: *Deleted

## 2013-10-17 ENCOUNTER — Ambulatory Visit: Payer: BC Managed Care – PPO | Admitting: Physician Assistant

## 2013-10-17 VITALS — BP 152/70 | HR 78 | Temp 98.2°F | Resp 18 | Ht 68.0 in | Wt 171.0 lb

## 2013-10-17 DIAGNOSIS — L03319 Cellulitis of trunk, unspecified: Secondary | ICD-10-CM

## 2013-10-17 DIAGNOSIS — M25569 Pain in unspecified knee: Secondary | ICD-10-CM

## 2013-10-17 DIAGNOSIS — L02219 Cutaneous abscess of trunk, unspecified: Secondary | ICD-10-CM

## 2013-10-17 DIAGNOSIS — M25561 Pain in right knee: Secondary | ICD-10-CM

## 2013-10-17 DIAGNOSIS — W57XXXA Bitten or stung by nonvenomous insect and other nonvenomous arthropods, initial encounter: Secondary | ICD-10-CM

## 2013-10-17 DIAGNOSIS — T148 Other injury of unspecified body region: Secondary | ICD-10-CM

## 2013-10-17 DIAGNOSIS — L03312 Cellulitis of back [any part except buttock]: Secondary | ICD-10-CM

## 2013-10-17 MED ORDER — DOXYCYCLINE HYCLATE 50 MG PO CAPS: 50.0000 mg | ORAL_CAPSULE | Freq: Two times a day (BID) | ORAL | Status: DC

## 2013-10-17 MED ORDER — MELOXICAM 7.5 MG PO TABS: 7.5000 mg | ORAL_TABLET | Freq: Every day | ORAL | Status: DC

## 2013-10-17 NOTE — Progress Notes (Signed)
   Subjective:    Patient ID: Caitlin Perez, female    DOB: Nov 06, 1950, 63 y.o.   MRN: [id number]  HPI 63 year old female presents for evaluation of possible infection surrounding a tick bite. Pulled tick off her back about 2 week ago.  Is sure she removed entire tick and also is confident it was attached <24 hours.  Since then she has noticed surrounding redness and a scab that won't heal.   No purulent drainage, fever, chills, nausea, vomiting, abdominal pain, or dizziness.   Also concerned about right knee pain. States she has "blown out her knee" several times, and "did it again," about 2 weeks ago. States it is doing much better and seems to be improving - she even used the elliptical last week and felt ok.  Continues to have some pain after completing workouts. Has noticed some swelling but does not have any painful locking or popping. Does not think she has had any ligament tears and has never had an MRI. She does wear a knee brace and take advil which helps. States she needs to start training because she is going on a 70 mile hike in [place] in 1 month.    Review of Systems  Constitutional: Negative for fever and chills.  Gastrointestinal: Negative for nausea, vomiting and abdominal pain.  Musculoskeletal: Positive for arthralgias and joint swelling.  Skin: Positive for rash.  Neurological: Negative for dizziness and headaches.       Objective:   Physical Exam  Constitutional: She is oriented to person, place, and time. She appears well-developed and well-nourished.  HENT:  Head: Normocephalic and atraumatic.  Right Ear: External ear normal.  Left Ear: External ear normal.  Eyes: Conjunctivae are normal.  Neck: Normal range of motion.  Cardiovascular: Normal rate.   Pulmonary/Chest: Effort normal.  Musculoskeletal:       Right knee: She exhibits swelling (medial tibial condyle). She exhibits normal range of motion, no effusion, no ecchymosis, no deformity, no erythema, no  LCL laxity and no MCL laxity. No tenderness found. No medial joint line, no lateral joint line, no MCL, no LCL and no patellar tendon tenderness noted.  Neurological: She is alert and oriented to person, place, and time.  Skin:     Psychiatric: She has a normal mood and affect. Her behavior is normal. Judgment and thought content normal.          Assessment & Plan:  Knee pain, right - Plan: meloxicam (MOBIC) 7.5 MG tablet  Cellulitis of back - Plan: doxycycline (VIBRAMYCIN) 50 MG capsule  Tick bite - Plan: doxycycline (VIBRAMYCIN) 50 MG capsule  Will treat tick bite for early cellulitis/infection with doxycycline 100 mg bid x 10 days RTC precautions discussed Knee pain - trial of mobic 7.5 mg 1-2 times daily with food (d/c ibuprofen).  Ok to start with elliptical training as tolerated. Continue to wear brace and ice for 20 minutes after exercise.  Follow up if symptoms worsen or fail to improve.

## 2013-12-13 ENCOUNTER — Other Ambulatory Visit: Payer: Self-pay | Admitting: Physician Assistant

## 2013-12-13 NOTE — Telephone Encounter (Signed)
Caitlin Perez, at 10/17/13 OV you gave pt a trial of mobic. Do you want to give her RFs?

## 2014-04-29 ENCOUNTER — Telehealth: Payer: Self-pay | Admitting: Family Medicine

## 2014-04-29 NOTE — Telephone Encounter (Signed)
Electronic refill request, please advise  

## 2014-04-29 NOTE — Telephone Encounter (Signed)
Please schedule annual exam and refill until then  

## 2014-04-30 NOTE — Telephone Encounter (Signed)
See prev note, once CPE is scheduled I will refill med

## 2014-04-30 NOTE — Telephone Encounter (Signed)
LVM on cell to c/b and schedule cpe with labs prior.

## 2014-04-30 NOTE — Telephone Encounter (Signed)
Med refilled.

## 2014-04-30 NOTE — Telephone Encounter (Signed)
Pt made appointment  For 09/10/14 wanted labs same day Please close

## 2014-09-03 ENCOUNTER — Other Ambulatory Visit: Payer: Self-pay

## 2014-09-03 DIAGNOSIS — Z1231 Encounter for screening mammogram for malignant neoplasm of breast: Secondary | ICD-10-CM

## 2014-09-04 ENCOUNTER — Ambulatory Visit
Admission: RE | Admit: 2014-09-04 | Discharge: 2014-09-04 | Disposition: A | Discharge status: Home / Self Care | Payer: BLUE CROSS/BLUE SHIELD | Source: Ambulatory Visit

## 2014-09-04 DIAGNOSIS — Z1231 Encounter for screening mammogram for malignant neoplasm of breast: Secondary | ICD-10-CM

## 2014-09-04 LAB — HM MAMMOGRAPHY: HM Mammogram: NORMAL

## 2014-09-08 ENCOUNTER — Encounter: Payer: Self-pay | Admitting: *Deleted

## 2014-09-09 ENCOUNTER — Encounter: Payer: Self-pay | Admitting: Family Medicine

## 2014-09-10 ENCOUNTER — Encounter: Payer: BC Managed Care – PPO | Admitting: Family Medicine

## 2014-09-10 ENCOUNTER — Telehealth: Payer: Self-pay | Admitting: Family Medicine

## 2014-09-10 DIAGNOSIS — Z0289 Encounter for other administrative examinations: Secondary | ICD-10-CM

## 2014-09-10 NOTE — Telephone Encounter (Signed)
Patient did not come in for their appointment today for cpe.  Please let me know if patient needs to be contacted immediately for follow up or no follow up needed. °

## 2014-11-01 ENCOUNTER — Other Ambulatory Visit: Payer: Self-pay | Admitting: Family Medicine

## 2015-03-09 ENCOUNTER — Other Ambulatory Visit: Payer: BLUE CROSS/BLUE SHIELD

## 2015-03-13 ENCOUNTER — Encounter: Payer: BLUE CROSS/BLUE SHIELD | Admitting: Family Medicine

## 2015-03-23 ENCOUNTER — Encounter (INDEPENDENT_AMBULATORY_CARE_PROVIDER_SITE_OTHER): Payer: Self-pay

## 2015-03-23 ENCOUNTER — Encounter: Payer: Self-pay | Admitting: Family Medicine

## 2015-03-23 ENCOUNTER — Ambulatory Visit (INDEPENDENT_AMBULATORY_CARE_PROVIDER_SITE_OTHER): Payer: BLUE CROSS/BLUE SHIELD | Admitting: Family Medicine

## 2015-03-23 VITALS — BP 136/88 | HR 64 | Temp 97.7°F | Ht 67.5 in | Wt 169.5 lb

## 2015-03-23 DIAGNOSIS — Z0000 Encounter for general adult medical examination without abnormal findings: Secondary | ICD-10-CM

## 2015-03-23 DIAGNOSIS — E785 Hyperlipidemia, unspecified: Secondary | ICD-10-CM | POA: Diagnosis not present

## 2015-03-23 LAB — LIPID PANEL
Cholesterol: 198 mg/dL (ref 0–200)
HDL: 74.7 mg/dL (ref >39.00)
LDL Cholesterol: 109 mg/dL — ABNORMAL HIGH (ref 0–99)
NonHDL: 123.3
Total CHOL/HDL Ratio: 3
Triglycerides: 74 mg/dL (ref 0.0–149.0)
VLDL: 14.8 mg/dL (ref 0.0–40.0)

## 2015-03-23 LAB — COMPREHENSIVE METABOLIC PANEL
ALT: 17 U/L (ref 0–35)
AST: 19 U/L (ref 0–37)
Albumin: 4.1 g/dL (ref 3.5–5.2)
Alkaline Phosphatase: 53 U/L (ref 39–117)
BUN: 17 mg/dL (ref 6–23)
CO2: 28 mEq/L (ref 19–32)
Calcium: 9.1 mg/dL (ref 8.4–10.5)
Chloride: 104 mEq/L (ref 96–112)
Creatinine, Ser: 0.82 mg/dL (ref 0.40–1.20)
GFR: 74.58 mL/min (ref >60.00)
Glucose, Bld: 93 mg/dL (ref 70–99)
Potassium: 4.3 mEq/L (ref 3.5–5.1)
Sodium: 140 mEq/L (ref 135–145)
Total Bilirubin: 0.6 mg/dL (ref 0.2–1.2)
Total Protein: 6.6 g/dL (ref 6.0–8.3)

## 2015-03-23 LAB — CBC WITH DIFFERENTIAL/PLATELET
Basophils Absolute: 0 10*3/uL (ref 0.0–0.1)
Basophils Relative: 0.6 % (ref 0.0–3.0)
Eosinophils Absolute: 0.1 10*3/uL (ref 0.0–0.7)
Eosinophils Relative: 2.6 % (ref 0.0–5.0)
HCT: 42.4 % (ref 36.0–46.0)
Hemoglobin: 14.2 g/dL (ref 12.0–15.0)
Lymphocytes Relative: 28.1 % (ref 12.0–46.0)
Lymphs Abs: 1.5 10*3/uL (ref 0.7–4.0)
MCHC: 33.5 g/dL (ref 30.0–36.0)
MCV: 94.8 fl (ref 78.0–100.0)
Monocytes Absolute: 0.4 10*3/uL (ref 0.1–1.0)
Monocytes Relative: 7.7 % (ref 3.0–12.0)
Neutro Abs: 3.3 10*3/uL (ref 1.4–7.7)
Neutrophils Relative %: 61 % (ref 43.0–77.0)
Platelets: 249 10*3/uL (ref 150.0–400.0)
RBC: 4.47 Mil/uL (ref 3.87–5.11)
RDW: 13.2 % (ref 11.5–15.5)
WBC: 5.4 10*3/uL (ref 4.0–10.5)

## 2015-03-23 LAB — TSH: TSH: 2.83 u[IU]/mL (ref 0.35–4.50)

## 2015-03-23 NOTE — Assessment & Plan Note (Signed)
Reviewed health habits including diet and exercise and skin cancer prevention Reviewed appropriate screening tests for age  Also reviewed health mt list, fam hx and immunization status , as well as social and family history   See HPI Labs today If you are interested in a shingles/zoster vaccine - call your insurance to check on coverage,( you should not get it within 1 month of other vaccines) , then call us for a prescription  for it to take to a pharmacy that gives the shot , or make a nurse visit to get it here depending on your coverage  I'm glad you had a flu shot Stay active  Eat a healthy low fat and low sugar diet

## 2015-03-23 NOTE — Patient Instructions (Signed)
Labs today  If you are interested in a shingles/zoster vaccine - call your insurance to check on coverage,( you should not get it within 1 month of other vaccines) , then call us for a prescription  for it to take to a pharmacy that gives the shot , or make a nurse visit to get it here depending on your coverage  I'm glad you had a flu shot Stay active  Eat a healthy low fat and low sugar diet

## 2015-03-23 NOTE — Progress Notes (Signed)
Subjective:    Patient ID: Caitlin Perez, female    DOB: 15-Dec-1950, 64 y.o.   MRN: [id number]  HPI Here for health maintenance exam and to review chronic medical problems    Doing well overall   Wt is down 1.5 lb  Takes care of herself   C/HIV screening  Not high risk -declines   Zoster vaccine -has not checked coverage /is interested   Flu shot - had one at work   Mm 4/16 nl  Self exam-no lumps   Pap nl 8/14 nl  No abn paps  No gyn symptoms or problems  No new sexual partners  Does not want any more paps   colonosc 2/08 10 year recall for that   Due for labs today   Diet - has been fair / her eating has good and bad days  Exercise - on and off  (she does a lot of yard work and walking and kayaking) [employer] about 15 miles per week -with neighbors   Some aches and pains  Knees and hips  Quite active   Patient Active Problem List   Diagnosis Date Noted  . Routine general medical examination at a health care facility 12/25/2012  . Encounter for routine gynecological examination 12/25/2012  . Fatigue 12/25/2012  . Knee pain, left 02/28/2012  . Hyperlipidemia 08/25/2009  . GENERALIZED ANXIETY DISORDER 01/27/2009   Past Medical History  Diagnosis Date  . Anxiety   . Other and unspecified hyperlipidemia    Past Surgical History  Procedure Laterality Date  . Colonoscopy  2009   Social History  Substance Use Topics  . Smoking status: Former Research scientist (life sciences)  . Smokeless tobacco: None     Comment: over 30 years ago  . Alcohol Use: 0.0 oz/week    0 Standard drinks or equivalent per week     Comment: 4 drinks/week   Family History  Problem Relation Age of Onset  . Ovarian cancer Maternal Aunt   . Heart disease      GP  . Stroke      GP  . Brain cancer      Parent  +smoker  . Depression Sister   . Anxiety disorder Sister   . Cancer Mother    Allergies  Allergen Reactions  . Codeine    Current Outpatient Prescriptions on File Prior to Visit    Medication Sig Dispense Refill  . PARoxetine (PAXIL) 20 MG tablet TAKE 1 TABLET BY MOUTH EVERY MORNING 90 tablet 1   No current facility-administered medications on file prior to visit.    Review of Systems    Review of Systems  Constitutional: Negative for fever, appetite change, fatigue and unexpected weight change.  Eyes: Negative for pain and visual disturbance.  Respiratory: Negative for cough and shortness of breath.   Cardiovascular: Negative for cp or palpitations    Gastrointestinal: Negative for nausea, diarrhea and constipation.  Genitourinary: Negative for urgency and frequency.  Skin: Negative for pallor or rash   MSK pos for intermittent knee pain  Neurological: Negative for weakness, light-headedness, numbness and headaches.  Hematological: Negative for adenopathy. Does not bruise/bleed easily.  Psychiatric/Behavioral: Negative for dysphoric mood. The patient is not nervous/anxious.      Objective:   Physical Exam  Constitutional: She appears well-developed and well-nourished. No distress.  Well appearing   HENT:  Head: Normocephalic and atraumatic.  Right Ear: External ear normal.  Left Ear: External ear normal.  Mouth/Throat: Oropharynx is clear and moist.  Eyes: Conjunctivae and EOM are normal. Pupils are equal, round, and reactive to light. No scleral icterus.  Neck: Normal range of motion. Neck supple. No JVD present. Carotid bruit is not present. No thyromegaly present.  Cardiovascular: Normal rate, regular rhythm, normal heart sounds and intact distal pulses.  Exam reveals no gallop.   Pulmonary/Chest: Effort normal and breath sounds normal. No respiratory distress. She has no wheezes. She exhibits no tenderness.  Abdominal: Soft. Bowel sounds are normal. She exhibits no distension, no abdominal bruit and no mass. There is no tenderness.  Genitourinary: No breast swelling, tenderness, discharge or bleeding.  Breast exam: No mass, nodules, thickening,  tenderness, bulging, retraction, inflamation, nipple discharge or skin changes noted.  No axillary or clavicular LA.      Musculoskeletal: Normal range of motion. She exhibits no edema or tenderness.  Lymphadenopathy:    She has no cervical adenopathy.  Neurological: She is alert. She has normal reflexes. No cranial nerve deficit. She exhibits normal muscle tone. Coordination normal.  Skin: Skin is warm and dry. No rash noted. No erythema. No pallor.  Psychiatric: She has a normal mood and affect.  Pleasant and talkative           Assessment & Plan:   Problem List Items Addressed This Visit      Other   Hyperlipidemia    Disc goals for lipids and reasons to control them Lab today  Rev low sat fat diet in detail       Routine general medical examination at a health care facility - Primary    Reviewed health habits including diet and exercise and skin cancer prevention Reviewed appropriate screening tests for age  Also reviewed health mt list, fam hx and immunization status , as well as social and family history   See HPI Labs today If you are interested in a shingles/zoster vaccine - call your insurance to check on coverage,( you should not get it within 1 month of other vaccines) , then call us for a prescription  for it to take to a pharmacy that gives the shot , or make a nurse visit to get it here depending on your coverage  I'm glad you had a flu shot Stay active  Eat a healthy low fat and low sugar diet       Relevant Orders   CBC with Differential/Platelet (Completed)   Comprehensive metabolic panel (Completed)   TSH (Completed)   Lipid panel (Completed)

## 2015-03-23 NOTE — Assessment & Plan Note (Signed)
Disc goals for lipids and reasons to control them Lab today  Rev low sat fat diet in detail

## 2015-03-23 NOTE — Progress Notes (Signed)
Pre visit review using our clinic review tool, if applicable. No additional management support is needed unless otherwise documented below in the visit note. 

## 2015-03-24 ENCOUNTER — Encounter: Payer: Self-pay | Admitting: *Deleted

## 2015-05-08 ENCOUNTER — Other Ambulatory Visit: Payer: Self-pay | Admitting: Family Medicine

## 2016-09-06 ENCOUNTER — Other Ambulatory Visit: Payer: Self-pay | Admitting: Family Medicine

## 2016-09-06 NOTE — Telephone Encounter (Signed)
Pt hasn't been seen since 03/2015 and no future appts., please advise

## 2016-09-06 NOTE — Telephone Encounter (Signed)
Please schedule a f/u and refill until then 

## 2016-09-06 NOTE — Telephone Encounter (Signed)
appt scheduled and med refilled 

## 2016-09-21 DIAGNOSIS — R69 Illness, unspecified: Secondary | ICD-10-CM | POA: Diagnosis not present

## 2016-09-21 DIAGNOSIS — Z0000 Encounter for general adult medical examination without abnormal findings: Secondary | ICD-10-CM | POA: Diagnosis not present

## 2016-09-21 DIAGNOSIS — G479 Sleep disorder, unspecified: Secondary | ICD-10-CM | POA: Diagnosis not present

## 2016-09-21 DIAGNOSIS — Z79899 Other long term (current) drug therapy: Secondary | ICD-10-CM | POA: Diagnosis not present

## 2016-09-21 DIAGNOSIS — Z6827 Body mass index (BMI) 27.0-27.9, adult: Secondary | ICD-10-CM | POA: Diagnosis not present

## 2016-10-17 ENCOUNTER — Telehealth: Payer: Self-pay | Admitting: Family Medicine

## 2016-10-17 DIAGNOSIS — Z0000 Encounter for general adult medical examination without abnormal findings: Secondary | ICD-10-CM

## 2016-10-17 NOTE — Telephone Encounter (Signed)
-----   Message from Ellamae Sia sent at 10/17/2016  2:41 PM EDT ----- Regarding: Lab orders for Monday, 6.11.18 Patient is scheduled for CPX labs, please order future labs, Thanks , Karna Christmas

## 2016-10-24 ENCOUNTER — Other Ambulatory Visit (INDEPENDENT_AMBULATORY_CARE_PROVIDER_SITE_OTHER): Payer: Medicare HMO

## 2016-10-24 DIAGNOSIS — Z0000 Encounter for general adult medical examination without abnormal findings: Secondary | ICD-10-CM | POA: Diagnosis not present

## 2016-10-24 LAB — COMPREHENSIVE METABOLIC PANEL
ALT: 21 U/L (ref 0–35)
AST: 20 U/L (ref 0–37)
Albumin: 4.1 g/dL (ref 3.5–5.2)
Alkaline Phosphatase: 52 U/L (ref 39–117)
BUN: 19 mg/dL (ref 6–23)
CO2: 28 mEq/L (ref 19–32)
Calcium: 9.3 mg/dL (ref 8.4–10.5)
Chloride: 104 mEq/L (ref 96–112)
Creatinine, Ser: 1.02 mg/dL (ref 0.40–1.20)
GFR: 57.69 mL/min — ABNORMAL LOW (ref >60.00)
Glucose, Bld: 95 mg/dL (ref 70–99)
Potassium: 4.4 mEq/L (ref 3.5–5.1)
Sodium: 139 mEq/L (ref 135–145)
Total Bilirubin: 0.5 mg/dL (ref 0.2–1.2)
Total Protein: 6.5 g/dL (ref 6.0–8.3)

## 2016-10-24 LAB — LIPID PANEL
Cholesterol: 187 mg/dL (ref 0–200)
HDL: 64.3 mg/dL (ref >39.00)
LDL Cholesterol: 111 mg/dL — ABNORMAL HIGH (ref 0–99)
NonHDL: 122.5
Total CHOL/HDL Ratio: 3
Triglycerides: 59 mg/dL (ref 0.0–149.0)
VLDL: 11.8 mg/dL (ref 0.0–40.0)

## 2016-10-24 LAB — CBC WITH DIFFERENTIAL/PLATELET
Basophils Absolute: 0 10*3/uL (ref 0.0–0.1)
Basophils Relative: 0.6 % (ref 0.0–3.0)
Eosinophils Absolute: 0.1 10*3/uL (ref 0.0–0.7)
Eosinophils Relative: 2.3 % (ref 0.0–5.0)
HCT: 41.3 % (ref 36.0–46.0)
Hemoglobin: 14 g/dL (ref 12.0–15.0)
Lymphocytes Relative: 28.6 % (ref 12.0–46.0)
Lymphs Abs: 1.4 10*3/uL (ref 0.7–4.0)
MCHC: 33.9 g/dL (ref 30.0–36.0)
MCV: 95.1 fl (ref 78.0–100.0)
Monocytes Absolute: 0.4 10*3/uL (ref 0.1–1.0)
Monocytes Relative: 7.7 % (ref 3.0–12.0)
Neutro Abs: 3.1 10*3/uL (ref 1.4–7.7)
Neutrophils Relative %: 60.8 % (ref 43.0–77.0)
Platelets: 245 10*3/uL (ref 150.0–400.0)
RBC: 4.34 Mil/uL (ref 3.87–5.11)
RDW: 12.9 % (ref 11.5–15.5)
WBC: 5.1 10*3/uL (ref 4.0–10.5)

## 2016-10-24 LAB — TSH: TSH: 1.72 u[IU]/mL (ref 0.35–4.50)

## 2016-10-26 ENCOUNTER — Encounter: Payer: Self-pay | Admitting: Family Medicine

## 2016-10-26 ENCOUNTER — Ambulatory Visit (INDEPENDENT_AMBULATORY_CARE_PROVIDER_SITE_OTHER): Payer: Medicare HMO | Admitting: Family Medicine

## 2016-10-26 VITALS — BP 124/82 | HR 70 | Temp 98.2°F | Ht 67.25 in | Wt 164.5 lb

## 2016-10-26 DIAGNOSIS — Z1211 Encounter for screening for malignant neoplasm of colon: Secondary | ICD-10-CM | POA: Insufficient documentation

## 2016-10-26 DIAGNOSIS — E785 Hyperlipidemia, unspecified: Secondary | ICD-10-CM

## 2016-10-26 DIAGNOSIS — Z0000 Encounter for general adult medical examination without abnormal findings: Secondary | ICD-10-CM | POA: Insufficient documentation

## 2016-10-26 DIAGNOSIS — E7800 Pure hypercholesterolemia, unspecified: Secondary | ICD-10-CM

## 2016-10-26 DIAGNOSIS — Z23 Encounter for immunization: Secondary | ICD-10-CM | POA: Diagnosis not present

## 2016-10-26 MED ORDER — PAROXETINE HCL 20 MG PO TABS: 20.0000 mg | ORAL_TABLET | Freq: Every morning | ORAL | 3 refills | Status: DC

## 2016-10-26 NOTE — Patient Instructions (Addendum)
We will refer you for colonoscopy on the way out   Don't forget to schedule your mammogram at the breast center   Take a look at the handout regarding a tetanus shot- you can get it at a pharmacy or the health department   You are a candidate for the new Shingrix vaccine when it becomes available-- in a pharmacy/check with your pharmacy/ insurance   Take a look at the blue packet regarding an advanced directive -this is important

## 2016-10-26 NOTE — Progress Notes (Signed)
Subjective:    Patient ID: Caitlin Perez, female    DOB: 13-Jul-1950, 66 y.o.   MRN: [id number]  HPI  Here for welcome to medicare preventative exam  I have personally reviewed the Medicare Annual Wellness questionnaire and have noted 1. The patient's medical and social history 2. Their use of alcohol, tobacco or illicit drugs 3. Their current medications and supplements 4. The patient's functional ability including ADL's, fall risks, home safety risks and hearing or visual             impairment. 5. Diet and physical activities 6. Evidence for depression or mood disorders  The patients weight, height, BMI have been recorded in the chart and visual acuity is per eye clinic.  I have made referrals, counseling and provided education to the patient based review of the above and I have provided the pt with a written personalized care plan for preventive services. Reviewed and updated provider list, see scanned forms.  See scanned forms.  Routine anticipatory guidance given to patient.  See health maintenance. Colon cancer screening  Colonoscopy 2/08- she wants to get that scheduled  Breast cancer screening  Mammogram 4/16 - she wants to get a mammogram Self breast exam- no lumps or changes  Pap test 8/14-never had abn pap, no gyn symptoms , no hx of HPV  She would rather skip the gyn  Flu vaccine - 9/16 , missed last fall  Tetanus vaccine 1/07-will look into getting it in a pharmacy  Pneumovax- will get pcv 13  Zoster vaccine -interested in Shingrix when available   Advance directive-does not have  Fall history -has hx of falls / she hikes and walks 3 dogs at a time (tends to trip)-no injuries however  Is interested in silver sneakers program and is more careful now  Cognitive function addressed- see scanned forms- and if abnormal then additional documentation follows. -no concerns overall /perhaps cognition is a bit slower  She retired      [employer] from [employer]  Encounters:  10/26/16 164 lb 8 oz (74.6 kg)  03/23/15 169 lb 8 oz (76.9 kg)  10/17/13 171 lb (77.6 kg)  bmi is 25.5   Hearing Screening   125Hz  250Hz  500Hz  1000Hz  2000Hz  3000Hz  4000Hz  6000Hz  8000Hz   Right ear:   40 40 40  40    Left ear:   40 40 40  40      Visual Acuity Screening   Right eye Left eye Both eyes  Without correction:     With correction: 20/25 20/25 20/25     PMH and SH reviewed  Meds, vitals, and allergies reviewed.   ROS: See HPI.  Otherwise negative.    Anxiety- she is doing fine with paxil - wants to stick with it    cholesterol Lab Results  Component Value Date   CHOL 187 10/24/2016   CHOL 198 03/23/2015   CHOL 199 12/25/2012   Lab Results  Component Value Date   HDL 64.30 10/24/2016   HDL 74.70 03/23/2015   HDL 73.40 12/25/2012   Lab Results  Component Value Date   LDLCALC 111 (H) 10/24/2016   LDLCALC 109 (H) 03/23/2015   LDLCALC 109 (H) 12/25/2012   Lab Results  Component Value Date   TRIG 59.0 10/24/2016   TRIG 74.0 03/23/2015   TRIG 84.0 12/25/2012   Lab Results  Component Value Date   CHOLHDL 3 10/24/2016   CHOLHDL 3 03/23/2015   CHOLHDL 3 12/25/2012   Lab Results  Component Value Date   LDLDIRECT 119.6 10/26/2009   LDLDIRECT 143.2 01/27/2009   Stable and well controlled  Eats fairly healthy   Results for orders placed or performed in visit on 10/24/16  CBC with Differential/Platelet  Result Value Ref Range   WBC 5.1 4.0 - 10.5 K/uL   RBC 4.34 3.87 - 5.11 Mil/uL   Hemoglobin 14.0 12.0 - 15.0 g/dL   HCT 41.3 36.0 - 46.0 %   MCV 95.1 78.0 - 100.0 fl   MCHC 33.9 30.0 - 36.0 g/dL   RDW 12.9 11.5 - 15.5 %   Platelets 245.0 150.0 - 400.0 K/uL   Neutrophils Relative % 60.8 43.0 - 77.0 %   Lymphocytes Relative 28.6 12.0 - 46.0 %   Monocytes Relative 7.7 3.0 - 12.0 %   Eosinophils Relative 2.3 0.0 - 5.0 %   Basophils Relative 0.6 0.0 - 3.0 %   Neutro Abs 3.1 1.4 - 7.7 K/uL   Lymphs Abs 1.4 0.7 - 4.0 K/uL   Monocytes  Absolute 0.4 0.1 - 1.0 K/uL   Eosinophils Absolute 0.1 0.0 - 0.7 K/uL   Basophils Absolute 0.0 0.0 - 0.1 K/uL  Comprehensive metabolic panel  Result Value Ref Range   Sodium 139 135 - 145 mEq/L   Potassium 4.4 3.5 - 5.1 mEq/L   Chloride 104 96 - 112 mEq/L   CO2 28 19 - 32 mEq/L   Glucose, Bld 95 70 - 99 mg/dL   BUN 19 6 - 23 mg/dL   Creatinine, Ser 1.02 0.40 - 1.20 mg/dL   Total Bilirubin 0.5 0.2 - 1.2 mg/dL   Alkaline Phosphatase 52 39 - 117 U/L   AST 20 0 - 37 U/L   ALT 21 0 - 35 U/L   Total Protein 6.5 6.0 - 8.3 g/dL   Albumin 4.1 3.5 - 5.2 g/dL   Calcium 9.3 8.4 - 10.5 mg/dL   GFR 57.69 (L) >60.00 mL/min  Lipid panel  Result Value Ref Range   Cholesterol 187 0 - 200 mg/dL   Triglycerides 59.0 0.0 - 149.0 mg/dL   HDL 64.30 >39.00 mg/dL   VLDL 11.8 0.0 - 40.0 mg/dL   LDL Cholesterol 111 (H) 0 - 99 mg/dL   Total CHOL/HDL Ratio 3    NonHDL 122.50   TSH  Result Value Ref Range   TSH 1.72 0.35 - 4.50 uIU/mL     Patient Active Problem List   Diagnosis Date Noted  . Welcome to Medicare preventive visit 10/26/2016  . Colon cancer screening 10/26/2016  . Routine general medical examination at a health care facility 12/25/2012  . Encounter for routine gynecological examination 12/25/2012  . Fatigue 12/25/2012  . Hyperlipidemia 08/25/2009  . GENERALIZED ANXIETY DISORDER 01/27/2009   Past Medical History:  Diagnosis Date  . Anxiety   . Other and unspecified hyperlipidemia    Past Surgical History:  Procedure Laterality Date  . COLONOSCOPY  2009   Social History  Substance Use Topics  . Smoking status: Former Research scientist (life sciences)  . Smokeless tobacco: Never Used     Comment: over 30 years ago  . Alcohol use 0.0 oz/week     Comment: 4 drinks/week   Family History  Problem Relation Age of Onset  . Depression Sister   . Cancer Mother   . Ovarian cancer Maternal Aunt   . Heart disease Unknown        GP  . Stroke Unknown        GP  . Brain cancer  Unknown        Parent   +smoker  . Anxiety disorder Sister    Allergies  Allergen Reactions  . Codeine    No current outpatient prescriptions on file prior to visit.   No current facility-administered medications on file prior to visit.     Review of Systems Review of Systems  Constitutional: Negative for fever, appetite change, fatigue and unexpected weight change.  Eyes: Negative for pain and visual disturbance.  Respiratory: Negative for cough and shortness of breath.   Cardiovascular: Negative for cp or palpitations    Gastrointestinal: Negative for nausea, diarrhea and constipation.  Genitourinary: Negative for urgency and frequency.  Skin: Negative for pallor or rash   Neurological: Negative for weakness, light-headedness, numbness and headaches.  Hematological: Negative for adenopathy. Does not bruise/bleed easily.  Psychiatric/Behavioral: Negative for dysphoric mood. The patient is not nervous/anxious.  (mood is good as long as she stays on paxil)       Objective:   Physical Exam  Constitutional: She appears well-developed and well-nourished. No distress.  Well appearing   HENT:  Head: Normocephalic and atraumatic.  Right Ear: External ear normal.  Left Ear: External ear normal.  Mouth/Throat: Oropharynx is clear and moist.  Eyes: Conjunctivae and EOM are normal. Pupils are equal, round, and reactive to light. No scleral icterus.  Neck: Normal range of motion. Neck supple. No JVD present. Carotid bruit is not present. No thyromegaly present.  Cardiovascular: Normal rate, regular rhythm, normal heart sounds and intact distal pulses.  Exam reveals no gallop.   Pulmonary/Chest: Effort normal and breath sounds normal. No respiratory distress. She has no wheezes. She exhibits no tenderness.  Abdominal: Soft. Bowel sounds are normal. She exhibits no distension, no abdominal bruit and no mass. There is no tenderness.  Genitourinary: No breast swelling, tenderness, discharge or bleeding.    Genitourinary Comments: Breast exam: No mass, nodules, thickening, tenderness, bulging, retraction, inflamation, nipple discharge or skin changes noted.  No axillary or clavicular LA.      Musculoskeletal: Normal range of motion. She exhibits no edema or tenderness.  Lymphadenopathy:    She has no cervical adenopathy.  Neurological: She is alert. She has normal reflexes. No cranial nerve deficit. She exhibits normal muscle tone. Coordination normal.  Skin: Skin is warm and dry. No rash noted. No erythema. No pallor.  Solar lentigines diffusely  Some scattered sks  Psychiatric: She has a normal mood and affect.          Assessment & Plan:   Problem List Items Addressed This Visit      Other   Colon cancer screening    Due for screening colonoscopy  Referral made      Relevant Orders   Ambulatory referral to Gastroenterology   Hyperlipidemia    Disc goals for lipids and reasons to control them Rev labs with pt Rev low sat fat diet in detail LDL of 111        Welcome to Medicare preventive visit - Primary    Reviewed health habits including diet and exercise and skin cancer prevention Reviewed appropriate screening tests for age  Also reviewed health mt list, fam hx and immunization status , as well as social and family history   See HPI Labs reviewed  We will refer you for colonoscopy on the way out   Don't forget to schedule your mammogram at the breast center   Take a look at the handout regarding a tetanus shot- you can  get it at a pharmacy or the health department   You are a candidate for the new Shingrix vaccine when it becomes available-- in a pharmacy/check with your pharmacy/ insurance   Take a look at the blue packet regarding an advanced directive -this is important   prevnar vaccine today        Other Visit Diagnoses    Need for vaccination with 13-polyvalent pneumococcal conjugate vaccine       Relevant Orders   Pneumococcal conjugate vaccine  13-valent (Completed)

## 2016-10-27 NOTE — Assessment & Plan Note (Signed)
Due for screening colonoscopy  Referral made

## 2016-10-27 NOTE — Assessment & Plan Note (Addendum)
Reviewed health habits including diet and exercise and skin cancer prevention Reviewed appropriate screening tests for age  Also reviewed health mt list, fam hx and immunization status , as well as social and family history   See HPI Labs reviewed  We will refer you for colonoscopy on the way out   Don't forget to schedule your mammogram at the breast center   Take a look at the handout regarding a tetanus shot- you can get it at a pharmacy or the health department   You are a candidate for the new Shingrix vaccine when it becomes available-- in a pharmacy/check with your pharmacy/ insurance   Take a look at the blue packet regarding an advanced directive -this is important   prevnar vaccine today

## 2016-10-27 NOTE — Assessment & Plan Note (Signed)
Disc goals for lipids and reasons to control them Rev labs with pt Rev low sat fat diet in detail LDL of 111

## 2016-10-28 ENCOUNTER — Other Ambulatory Visit: Payer: Self-pay | Admitting: Family Medicine

## 2016-10-28 DIAGNOSIS — Z1231 Encounter for screening mammogram for malignant neoplasm of breast: Secondary | ICD-10-CM

## 2016-11-15 ENCOUNTER — Ambulatory Visit
Admission: RE | Admit: 2016-11-15 | Discharge: 2016-11-15 | Disposition: A | Discharge status: Home / Self Care | Payer: Medicare HMO | Source: Ambulatory Visit | Attending: Family Medicine | Admitting: Family Medicine

## 2016-11-15 DIAGNOSIS — Z1231 Encounter for screening mammogram for malignant neoplasm of breast: Secondary | ICD-10-CM | POA: Diagnosis not present

## 2016-11-17 ENCOUNTER — Encounter: Payer: Self-pay | Admitting: *Deleted

## 2016-11-25 ENCOUNTER — Encounter: Payer: Self-pay | Admitting: Family Medicine

## 2016-11-25 DIAGNOSIS — Z1211 Encounter for screening for malignant neoplasm of colon: Secondary | ICD-10-CM | POA: Diagnosis not present

## 2017-01-10 DIAGNOSIS — R69 Illness, unspecified: Secondary | ICD-10-CM | POA: Diagnosis not present

## 2017-02-17 DIAGNOSIS — R69 Illness, unspecified: Secondary | ICD-10-CM | POA: Diagnosis not present

## 2017-05-26 DIAGNOSIS — R69 Illness, unspecified: Secondary | ICD-10-CM | POA: Diagnosis not present

## 2017-07-25 DIAGNOSIS — R69 Illness, unspecified: Secondary | ICD-10-CM | POA: Diagnosis not present

## 2017-08-14 ENCOUNTER — Encounter: Payer: Self-pay | Admitting: Family Medicine

## 2017-08-14 ENCOUNTER — Ambulatory Visit (INDEPENDENT_AMBULATORY_CARE_PROVIDER_SITE_OTHER): Payer: Medicare HMO | Admitting: Family Medicine

## 2017-08-14 ENCOUNTER — Other Ambulatory Visit: Payer: Self-pay

## 2017-08-14 VITALS — BP 150/66 | HR 74 | Temp 98.4°F | Ht 67.25 in | Wt 162.5 lb

## 2017-08-14 DIAGNOSIS — S63501A Unspecified sprain of right wrist, initial encounter: Secondary | ICD-10-CM

## 2017-08-14 NOTE — Progress Notes (Signed)
Dr. Frederico Hamman T. Cote Mayabb, MD, [facility] [place], [address] Phone: [phone] Fax: [phone]  08/14/2017  Patient: Caitlin Perez, MRN: [id number], DOB: January 05, 1951, 67 y.o.  Primary Physician:  Tower, Wynelle Fanny, MD   Chief Complaint  Patient presents with  . Wrist Pain    Right-fell on landed on hand Saturday   Subjective:   Caitlin Perez is a 67 y.o. very pleasant female patient who presents with the following:  DOI: 08/12/2017  Lead on Saturday, tripped over a root. Felt really bad this morning. Felt pretty rough this morning. A little better now. Has taken some aspirin. Had her dachsund in her shirt and fell and was trying to protect him.   Past Medical History, Surgical History, Social History, Family History, Problem List, Medications, and Allergies have been reviewed and updated if relevant.  Patient Active Problem List   Diagnosis Date Noted  . Welcome to Medicare preventive visit 10/26/2016  . Colon cancer screening 10/26/2016  . Routine general medical examination at a health care facility 12/25/2012  . Encounter for routine gynecological examination 12/25/2012  . Fatigue 12/25/2012  . Hyperlipidemia 08/25/2009  . GENERALIZED ANXIETY DISORDER 01/27/2009    Past Medical History:  Diagnosis Date  . Anxiety   . Other and unspecified hyperlipidemia     Past Surgical History:  Procedure Laterality Date  . BREAST BIOPSY Right   . COLONOSCOPY  2009    Social History   Socioeconomic History  . Marital status: Married    Spouse name: Not on file  . Number of children: Not on file  . Years of education: Not on file  . Highest education level: Not on file  Occupational History  . Occupation: Optometrist  Social Needs  . Financial resource strain: Not on file  . Food insecurity:    Worry: Not on file    Inability: Not on file  . Transportation needs:    Medical: Not on file   Non-medical: Not on file  Tobacco Use  . Smoking status: Former Research scientist (life sciences)  . Smokeless tobacco: Never Used  . Tobacco comment: over 30 years ago  Substance and Sexual Activity  . Alcohol use: Yes    Alcohol/week: 0.0 oz    Comment: 4 drinks/week  . Drug use: No  . Sexual activity: Not on file  Lifestyle  . Physical activity:    Days per week: Not on file    Minutes per session: Not on file  . Stress: Not on file  Relationships  . Social connections:    Talks on phone: Not on file    Gets together: Not on file    Attends religious service: Not on file    Active member of club or organization: Not on file    Attends meetings of clubs or organizations: Not on file    Relationship status: Not on file  . Intimate partner violence:    Fear of current or ex partner: Not on file    Emotionally abused: Not on file    Physically abused: Not on file    Forced sexual activity: Not on file  Other Topics Concern  . 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  Depression Sister   . Cancer Mother   . Ovarian cancer Maternal Aunt   . Heart disease Unknown        GP  . Stroke Unknown        GP  . Brain cancer Unknown        Parent  +smoker  . Anxiety disorder Sister   . Breast cancer Neg Hx     Allergies  Allergen Reactions  . Codeine     Medication list reviewed and updated in full in [facility].  GEN: No fevers, chills. Nontoxic. Primarily MSK c/o today. MSK: Detailed in the HPI GI: tolerating PO intake without difficulty Neuro: No numbness, parasthesias, or tingling associated. Otherwise the pertinent positives of the ROS are noted above.   Objective:   BP (!) 150/66   Pulse 74   Temp 98.4 F (36.9 C) (Oral)   Ht 5' 7.25" (1.708 m)   Wt 162 lb 8 oz (73.7 kg)   BMI 25.26 kg/m   . GEN:  WDWN, NAD, Non-toxic, Alert & Oriented x 3 HEENT: Atraumatic, Normocephalic.  Ears and Nose: No external deformity. EXTR: No clubbing/cyanosis/edema NEURO: Normal gait.  PSYCH: Normally interactive. Conversant. Not depressed or anxious appearing.  Calm demeanor.   Hand: R Ecchymosis or edema: neg ROM wrist/hand/digits/elbow: full  Carpals, MCP's, digits: NT Distal Ulna and Radius: NT Supination lift test: neg Ecchymosis or edema: neg Cysts/nodules: neg Finkelstein's test: neg Snuffbox tenderness: neg Scaphoid tubercle: NT Hook of Hamate: NT Resisted supination: NT Full composite fist Grip, all digits: 5/5 str No tenosynovitis Axial load test: mild pos Phalen's: neg Tinel's: neg Atrophy: neg  Hand sensation: intact   Radiology: No results found.  Assessment and Plan:   Wrist sprain, right, initial encounter  Mild DRUJ sprain. C/w compression wrap that she already does well. Would anticipate that she will do well in 10-14 d.  Follow-up: prn only  Signed,  Bernadette Gores T. Shakyra Mattera, MD   Allergies as of 08/14/2017      Reactions   Codeine       Medication List        Accurate as of 08/14/17 10:15 AM. Always use your most recent med list.          PARoxetine 20 MG tablet Commonly known as:  PAXIL Take 1 tablet (20 mg total) by mouth every morning.

## 2017-10-10 ENCOUNTER — Other Ambulatory Visit: Payer: Self-pay | Admitting: Family Medicine

## 2017-10-10 DIAGNOSIS — Z1231 Encounter for screening mammogram for malignant neoplasm of breast: Secondary | ICD-10-CM

## 2017-11-20 ENCOUNTER — Ambulatory Visit
Admission: RE | Admit: 2017-11-20 | Discharge: 2017-11-20 | Disposition: A | Discharge status: Home / Self Care | Payer: Medicare HMO | Source: Ambulatory Visit | Attending: Family Medicine | Admitting: Family Medicine

## 2017-11-20 DIAGNOSIS — Z1231 Encounter for screening mammogram for malignant neoplasm of breast: Secondary | ICD-10-CM | POA: Diagnosis not present

## 2018-01-08 DIAGNOSIS — D6832 Hemorrhagic disorder due to extrinsic circulating anticoagulants: Secondary | ICD-10-CM | POA: Diagnosis not present

## 2018-01-08 DIAGNOSIS — R69 Illness, unspecified: Secondary | ICD-10-CM | POA: Diagnosis not present

## 2018-01-08 DIAGNOSIS — T45515D Adverse effect of anticoagulants, subsequent encounter: Secondary | ICD-10-CM | POA: Diagnosis not present

## 2018-01-09 DIAGNOSIS — Z809 Family history of malignant neoplasm, unspecified: Secondary | ICD-10-CM | POA: Diagnosis not present

## 2018-01-09 DIAGNOSIS — Z818 Family history of other mental and behavioral disorders: Secondary | ICD-10-CM | POA: Diagnosis not present

## 2018-01-09 DIAGNOSIS — R69 Illness, unspecified: Secondary | ICD-10-CM | POA: Diagnosis not present

## 2018-01-09 DIAGNOSIS — G629 Polyneuropathy, unspecified: Secondary | ICD-10-CM | POA: Diagnosis not present

## 2018-01-09 DIAGNOSIS — R030 Elevated blood-pressure reading, without diagnosis of hypertension: Secondary | ICD-10-CM | POA: Diagnosis not present

## 2018-01-09 DIAGNOSIS — R32 Unspecified urinary incontinence: Secondary | ICD-10-CM | POA: Diagnosis not present

## 2018-01-09 DIAGNOSIS — K08409 Partial loss of teeth, unspecified cause, unspecified class: Secondary | ICD-10-CM | POA: Diagnosis not present

## 2018-01-09 DIAGNOSIS — Z7722 Contact with and (suspected) exposure to environmental tobacco smoke (acute) (chronic): Secondary | ICD-10-CM | POA: Diagnosis not present

## 2018-01-09 DIAGNOSIS — J309 Allergic rhinitis, unspecified: Secondary | ICD-10-CM | POA: Diagnosis not present

## 2018-01-09 DIAGNOSIS — Z803 Family history of malignant neoplasm of breast: Secondary | ICD-10-CM | POA: Diagnosis not present

## 2018-01-25 DIAGNOSIS — R69 Illness, unspecified: Secondary | ICD-10-CM | POA: Diagnosis not present

## 2018-01-31 ENCOUNTER — Ambulatory Visit (INDEPENDENT_AMBULATORY_CARE_PROVIDER_SITE_OTHER): Payer: Medicare HMO | Admitting: Family Medicine

## 2018-01-31 ENCOUNTER — Encounter: Payer: Self-pay | Admitting: Family Medicine

## 2018-01-31 VITALS — BP 152/86 | HR 65 | Temp 98.3°F | Ht 67.5 in | Wt 160.5 lb

## 2018-01-31 DIAGNOSIS — I10 Essential (primary) hypertension: Secondary | ICD-10-CM

## 2018-01-31 DIAGNOSIS — Z1211 Encounter for screening for malignant neoplasm of colon: Secondary | ICD-10-CM | POA: Diagnosis not present

## 2018-01-31 DIAGNOSIS — Z0000 Encounter for general adult medical examination without abnormal findings: Secondary | ICD-10-CM | POA: Insufficient documentation

## 2018-01-31 DIAGNOSIS — E2839 Other primary ovarian failure: Secondary | ICD-10-CM

## 2018-01-31 DIAGNOSIS — E7800 Pure hypercholesterolemia, unspecified: Secondary | ICD-10-CM

## 2018-01-31 DIAGNOSIS — Z23 Encounter for immunization: Secondary | ICD-10-CM | POA: Diagnosis not present

## 2018-01-31 MED ORDER — LISINOPRIL 10 MG PO TABS: 10.0000 mg | ORAL_TABLET | Freq: Every day | ORAL | 11 refills | Status: DC

## 2018-01-31 NOTE — Patient Instructions (Addendum)
If you want a tetanus vaccine- check on pricing at pharmacy or health dept   If you are interested in the new shingles vaccine (Shingrix) - call your local pharmacy to check on coverage and availability  If affordable - get on a wait list   Try to get 1200-1500 mg of calcium per day with at least 1000 iu of vitamin D - for bone health   Let me know if hearing bothers you   Start lisinopril 10 mg daily  Avoid excess sodium /processed foods Keep exercising  Follow up in about a month  We will refer you for a bone density test   Pneumonia vaccine today

## 2018-01-31 NOTE — Assessment & Plan Note (Signed)
Disc goals for lipids and reasons to control them Rev last labs with pt Rev low sat fat diet in detail Labs today   

## 2018-01-31 NOTE — Progress Notes (Signed)
Subjective:    Patient ID: Caitlin Perez, female    DOB: Nov 23, 1950, 67 y.o.   MRN: [id number]  HPI  I have personally reviewed the Medicare Annual Wellness questionnaire and have noted 1. The patient's medical and social history 2. Their use of alcohol, tobacco or illicit drugs 3. Their current medications and supplements 4. The patient's functional ability including ADL's, fall risks, home safety risks and hearing or visual             impairment. 5. Diet and physical activities 6. Evidence for depression or mood disorders  The patients weight, height, BMI have been recorded in the chart and visual acuity is per eye clinic.  I have made referrals, counseling and provided education to the patient based review of the above and I have provided the pt with a written personalized care plan for preventive services. Reviewed and updated provider list, see scanned forms.  Wt Readings from Last 3 Encounters:  01/31/18 160 lb 8 oz (72.8 kg)  08/14/17 162 lb 8 oz (73.7 kg)  10/26/16 164 lb 8 oz (74.6 kg)  stable  Taking care of herself  24.77 kg/m   See scanned forms.  Routine anticipatory guidance given to patient.  See health maintenance. Colon cancer screening colonoscopy 7/18 -normal with 10 y recall  Breast cancer screening  Mammogram 7/19  Self breast exam-no lumps or changes  Sister was diagnosed with early breast cancer  Pap 8/14 nl /no hx of hpv Flu vaccine-had on 01/26/18  Tetanus vaccine 07 Had prevnar 6/18, due for PNA 23 Zoster vaccine-interested  dexa -interested in bone density  No falls or fx Not on ca or D  [facility] and [facility]  Advance directive-has her living will and POA  (husband)  Cognitive function addressed- see scanned forms- and if abnormal then additional documentation follows.  No problems that are worrisome  occ word finding- not bad   PMH and SH reviewed  Meds, vitals, and allergies reviewed.    ROS: See HPI.  Otherwise negative.     Hearing Screening   125Hz  250Hz  500Hz  1000Hz  2000Hz  3000Hz  4000Hz  6000Hz  8000Hz   Right ear:   0 0 0  0    Left ear:   40 40 40  40      Visual Acuity Screening   Right eye Left eye Both eyes  Without correction:     With correction: 20/25 20/25 20/25     She used a wax removal kit   BP is elevated  BP Readings from Last 3 Encounters:  01/31/18 (!) 152/86  08/14/17 (!) 150/66  10/26/16 124/82   Was also high at home - insurance visit  Has family hx of HTN  She feels her heart beat when she lies on her stomach No cp or sob    Due for labs  Lab Results  Component Value Date   CHOL 187 10/24/2016   HDL 64.30 10/24/2016   LDLCALC 111 (H) 10/24/2016   LDLDIRECT 119.6 10/26/2009   TRIG 59.0 10/24/2016   CHOLHDL 3 10/24/2016    Diet is good for cholesterol  Some red meat -not often  No fatty pork   Patient Active Problem List   Diagnosis Date Noted  . Medicare annual wellness visit, initial 01/31/2018  . Estrogen deficiency 01/31/2018  . Essential hypertension 01/31/2018  . Welcome to Medicare preventive visit 10/26/2016  . Colon cancer screening 10/26/2016  . Routine general medical examination  at a health care facility 12/25/2012  . Encounter for routine gynecological examination 12/25/2012  . Fatigue 12/25/2012  . Hyperlipidemia 08/25/2009  . GENERALIZED ANXIETY DISORDER 01/27/2009   Past Medical History:  Diagnosis Date  . Anxiety   . Other and unspecified hyperlipidemia    Past Surgical History:  Procedure Laterality Date  . BREAST BIOPSY Right   . COLONOSCOPY  2009   Social History   Tobacco Use  . Smoking status: Former Research scientist (life sciences)  . Smokeless tobacco: Never Used  . Tobacco comment: over 30 years ago  Substance Use Topics  . Alcohol use: Yes    Alcohol/week: 0.0 standard drinks    Comment: 4 drinks/week  . Drug use: No   Family History  Problem Relation Age of Onset  . Depression Sister   . Cancer  Mother   . Ovarian cancer Maternal Aunt   . Heart disease Unknown        GP  . Stroke Unknown        GP  . Brain cancer Unknown        Parent  +smoker  . Anxiety disorder Sister   . Breast cancer Neg Hx    Allergies  Allergen Reactions  . Codeine    Current Outpatient Medications on File Prior to Visit  Medication Sig Dispense Refill  . PARoxetine (PAXIL) 20 MG tablet Take 1 tablet (20 mg total) by mouth every morning. 90 tablet 3   No current facility-administered medications on file prior to visit.     Review of Systems  Constitutional: Negative for activity change, appetite change, fatigue, fever and unexpected weight change.  HENT: Negative for congestion, ear pain, rhinorrhea, sinus pressure and sore throat.   Eyes: Negative for pain, redness and visual disturbance.  Respiratory: Negative for cough, shortness of breath and wheezing.   Cardiovascular: Negative for chest pain and palpitations.  Gastrointestinal: Negative for abdominal pain, blood in stool, constipation and diarrhea.  Endocrine: Negative for polydipsia and polyuria.  Genitourinary: Negative for dysuria, frequency and urgency.  Musculoskeletal: Negative for arthralgias, back pain and myalgias.  Skin: Negative for pallor and rash.  Allergic/Immunologic: Negative for environmental allergies.  Neurological: Negative for dizziness, syncope and headaches.  Hematological: Negative for adenopathy. Does not bruise/bleed easily.  Psychiatric/Behavioral: Negative for decreased concentration and dysphoric mood. The patient is not nervous/anxious.        Objective:   Physical Exam  Constitutional: She appears well-developed and well-nourished. No distress.  Well appearing   HENT:  Head: Normocephalic and atraumatic.  Right Ear: External ear normal.  Left Ear: External ear normal.  Mouth/Throat: Oropharynx is clear and moist.  Eyes: Pupils are equal, round, and reactive to light. Conjunctivae and EOM are normal.  No scleral icterus.  Neck: Normal range of motion. Neck supple. No JVD present. Carotid bruit is not present. No thyromegaly present.  Cardiovascular: Normal rate, regular rhythm, normal heart sounds and intact distal pulses. Exam reveals no gallop.  Pulmonary/Chest: Effort normal and breath sounds normal. No respiratory distress. She has no wheezes. She exhibits no tenderness. No breast tenderness, discharge or bleeding.  Abdominal: Soft. Bowel sounds are normal. She exhibits no distension, no abdominal bruit and no mass. There is no tenderness.  Genitourinary:  Genitourinary Comments: Breast exam: No mass, nodules, thickening, tenderness, bulging, retraction, inflamation, nipple discharge or skin changes noted.  No axillary or clavicular LA.      Musculoskeletal: Normal range of motion. She exhibits no edema, tenderness or deformity.  Lymphadenopathy:  She has no cervical adenopathy.  Neurological: She is alert. She has normal reflexes. She displays normal reflexes. No cranial nerve deficit. She exhibits normal muscle tone. Coordination normal.  Skin: Skin is warm and dry. No rash noted. No erythema. No pallor.  Solar lentigines diffusely  Some sks   Psychiatric: She has a normal mood and affect.  Pleasant           Assessment & Plan:   Problem List Items Addressed This Visit      Cardiovascular and Mediastinum   Essential hypertension    bp is up  Will try lisinopril 10 mg daily  Disc poss side eff/will update  Disc lifestyle change and DASH eating  Continue exercise  F/u 1 mo      Relevant Medications   lisinopril (PRINIVIL,ZESTRIL) 10 MG tablet   Other Relevant Orders   CBC with Differential/Platelet   Comprehensive metabolic panel   Lipid panel   TSH     Other   Colon cancer screening    utd colonoscopy 7.18       Estrogen deficiency    Refer for screening dexa  Disc need for calcium/ vitamin D/ wt bearing exercise and bone density test every 2 y to  monitor Disc safety/ fracture risk in detail        Relevant Orders   DG Bone Density   Hyperlipidemia    Disc goals for lipids and reasons to control them Rev last labs with pt Rev low sat fat diet in detail  Labs today      Relevant Medications   lisinopril (PRINIVIL,ZESTRIL) 10 MG tablet   Other Relevant Orders   Lipid panel   Medicare annual wellness visit, initial - Primary    Reviewed health habits including diet and exercise and skin cancer prevention Reviewed appropriate screening tests for age  Also reviewed health mt list, fam hx and immunization status , as well as social and family history   See HPI Labs ordered  dexa ordered  PNA 23 vaccine  Had her flu shot  Enc her to bring a copy of adv directive (poa is husband and she wants to be full code)  No cognitive changes  Hearing does not bother her Will start tx for HTN        Routine general medical examination at a health care facility    Reviewed health habits including diet and exercise and skin cancer prevention Reviewed appropriate screening tests for age  Also reviewed health mt list, fam hx and immunization status , as well as social and family history   See HPI Labs ordered  dexa ordered  PNA 23 vaccine  Had her flu shot  Enc her to bring a copy of adv directive (poa is husband and she wants to be full code)  No cognitive changes  Hearing does not bother her Will start tx for HTN        Other Visit Diagnoses    Need for 23-polyvalent pneumococcal polysaccharide vaccine       Relevant Orders   Pneumococcal polysaccharide vaccine 23-valent greater than or equal to 2yo subcutaneous/IM (Completed)

## 2018-01-31 NOTE — Assessment & Plan Note (Signed)
bp is up  Will try lisinopril 10 mg daily  Disc poss side eff/will update  Disc lifestyle change and DASH eating  Continue exercise  F/u 1 mo

## 2018-01-31 NOTE — Assessment & Plan Note (Signed)
Reviewed health habits including diet and exercise and skin cancer prevention Reviewed appropriate screening tests for age  Also reviewed health mt list, fam hx and immunization status , as well as social and family history   See HPI Labs ordered  dexa ordered  PNA 23 vaccine  Had her flu shot  Enc her to bring a copy of adv directive (poa is husband and she wants to be full code)  No cognitive changes  Hearing does not bother her Will start tx for HTN

## 2018-01-31 NOTE — Assessment & Plan Note (Signed)
utd colonoscopy 7.18

## 2018-01-31 NOTE — Assessment & Plan Note (Signed)
Refer for screening dexa  Disc need for calcium/ vitamin D/ wt bearing exercise and bone density test every 2 y to monitor Disc safety/ fracture risk in detail

## 2018-02-01 ENCOUNTER — Encounter: Payer: Self-pay | Admitting: Family Medicine

## 2018-02-01 LAB — COMPREHENSIVE METABOLIC PANEL
ALT: 18 U/L (ref 0–35)
AST: 18 U/L (ref 0–37)
Albumin: 4.1 g/dL (ref 3.5–5.2)
Alkaline Phosphatase: 48 U/L (ref 39–117)
BUN: 21 mg/dL (ref 6–23)
CO2: 29 mEq/L (ref 19–32)
Calcium: 9.7 mg/dL (ref 8.4–10.5)
Chloride: 105 mEq/L (ref 96–112)
Creatinine, Ser: 1.09 mg/dL (ref 0.40–1.20)
GFR: 53.23 mL/min — ABNORMAL LOW (ref >60.00)
Glucose, Bld: 86 mg/dL (ref 70–99)
Potassium: 4.5 mEq/L (ref 3.5–5.1)
Sodium: 141 mEq/L (ref 135–145)
Total Bilirubin: 0.5 mg/dL (ref 0.2–1.2)
Total Protein: 6.7 g/dL (ref 6.0–8.3)

## 2018-02-01 LAB — CBC WITH DIFFERENTIAL/PLATELET
Basophils Absolute: 0 10*3/uL (ref 0.0–0.1)
Basophils Relative: 0.3 % (ref 0.0–3.0)
Eosinophils Absolute: 0.1 10*3/uL (ref 0.0–0.7)
Eosinophils Relative: 1.5 % (ref 0.0–5.0)
HCT: 40 % (ref 36.0–46.0)
Hemoglobin: 13.6 g/dL (ref 12.0–15.0)
Lymphocytes Relative: 21.5 % (ref 12.0–46.0)
Lymphs Abs: 1.6 10*3/uL (ref 0.7–4.0)
MCHC: 33.9 g/dL (ref 30.0–36.0)
MCV: 95.2 fl (ref 78.0–100.0)
Monocytes Absolute: 0.6 10*3/uL (ref 0.1–1.0)
Monocytes Relative: 8.8 % (ref 3.0–12.0)
Neutro Abs: 4.9 10*3/uL (ref 1.4–7.7)
Neutrophils Relative %: 67.9 % (ref 43.0–77.0)
Platelets: 253 10*3/uL (ref 150.0–400.0)
RBC: 4.2 Mil/uL (ref 3.87–5.11)
RDW: 13 % (ref 11.5–15.5)
WBC: 7.3 10*3/uL (ref 4.0–10.5)

## 2018-02-01 LAB — LIPID PANEL
Cholesterol: 181 mg/dL (ref 0–200)
HDL: 70 mg/dL (ref >39.00)
LDL Cholesterol: 92 mg/dL (ref 0–99)
NonHDL: 111.18
Total CHOL/HDL Ratio: 3
Triglycerides: 95 mg/dL (ref 0.0–149.0)
VLDL: 19 mg/dL (ref 0.0–40.0)

## 2018-02-01 LAB — TSH: TSH: 2.91 u[IU]/mL (ref 0.35–4.50)

## 2018-02-02 ENCOUNTER — Encounter: Payer: Self-pay | Admitting: *Deleted

## 2018-02-23 ENCOUNTER — Telehealth: Payer: Self-pay | Admitting: Family Medicine

## 2018-02-23 NOTE — Telephone Encounter (Signed)
I don't think they will let me refer that far out  Let me know a month or two before then and I will put referral in Thanks

## 2018-02-23 NOTE — Telephone Encounter (Signed)
FYI Spoke with pt about her bone density.  She stated she will make her own appointment.  She will schedule in 11/2018 with her mammogram

## 2018-03-12 ENCOUNTER — Ambulatory Visit (INDEPENDENT_AMBULATORY_CARE_PROVIDER_SITE_OTHER): Payer: Medicare HMO | Admitting: Family Medicine

## 2018-03-12 ENCOUNTER — Encounter: Payer: Self-pay | Admitting: Family Medicine

## 2018-03-12 VITALS — BP 138/84 | HR 67 | Temp 97.7°F | Ht 67.5 in | Wt 160.5 lb

## 2018-03-12 DIAGNOSIS — I10 Essential (primary) hypertension: Secondary | ICD-10-CM

## 2018-03-12 NOTE — Assessment & Plan Note (Signed)
bp is improved with lisinopril bp in fair control at this time  BP Readings from Last 1 Encounters:  03/12/18 138/84   No changes needed Most recent labs reviewed  Disc lifstyle change with low sodium diet and exercise

## 2018-03-12 NOTE — Progress Notes (Signed)
Subjective:    Patient ID: Caitlin Perez, female    DOB: [birth year], 67 y.o.   MRN: [id number]  HPI Here for f/u for blood pressure  Wt Readings from Last 3 Encounters:  03/12/18 160 lb 8 oz (72.8 kg)  01/31/18 160 lb 8 oz (72.8 kg)  08/14/17 162 lb 8 oz (73.7 kg)  stable  24.77 kg/m  bp is improved today after starting lisinopril 10 mg at last visit  No cp or palpitations or headaches or edema  No side effects to medicines  BP Readings from Last 3 Encounters:  03/12/18 138/84  01/31/18 (!) 152/86  08/14/17 (!) 150/66     No problems from the lisinopril  About 3 weeks ago =bp was high so she stopped checking it   Staying active  Eating well   Patient Active Problem List   Diagnosis Date Noted  . Medicare annual wellness visit, initial 01/31/2018  . Estrogen deficiency 01/31/2018  . Essential hypertension 01/31/2018  . Welcome to Medicare preventive visit 10/26/2016  . Colon cancer screening 10/26/2016  . Routine general medical examination at a health care facility 12/25/2012  . Encounter for routine gynecological examination 12/25/2012  . Fatigue 12/25/2012  . Hyperlipidemia 08/25/2009  . GENERALIZED ANXIETY DISORDER 01/27/2009   Past Medical History:  Diagnosis Date  . Anxiety   . Other and unspecified hyperlipidemia    Past Surgical History:  Procedure Laterality Date  . BREAST BIOPSY Right   . COLONOSCOPY  2009   Social History   Tobacco Use  . Smoking status: Former Research scientist (life sciences)  . Smokeless tobacco: Never Used  . Tobacco comment: over 30 years ago  Substance Use Topics  . Alcohol use: Yes    Alcohol/week: 0.0 standard drinks    Comment: 4 drinks/week  . Drug use: No   Family History  Problem Relation Age of Onset  . Depression Sister   . Breast cancer Sister   . Cancer Mother   . Ovarian cancer Maternal Aunt   . Heart disease Unknown        GP  . Stroke Unknown        GP  . Brain cancer Unknown        Parent  +smoker  . Anxiety  disorder Sister    Allergies  Allergen Reactions  . Codeine    Current Outpatient Medications on File Prior to Visit  Medication Sig Dispense Refill  . lisinopril (PRINIVIL,ZESTRIL) 10 MG tablet Take 1 tablet (10 mg total) by mouth daily. 30 tablet 11  . PARoxetine (PAXIL) 20 MG tablet Take 1 tablet (20 mg total) by mouth every morning. 90 tablet 3   No current facility-administered medications on file prior to visit.      Review of Systems  Constitutional: Negative for activity change, appetite change, fatigue, fever and unexpected weight change.  HENT: Negative for congestion, ear pain, rhinorrhea, sinus pressure and sore throat.   Eyes: Negative for pain, redness and visual disturbance.  Respiratory: Negative for cough, shortness of breath and wheezing.   Cardiovascular: Negative for chest pain and palpitations.  Gastrointestinal: Negative for abdominal pain, blood in stool, constipation and diarrhea.  Endocrine: Negative for polydipsia and polyuria.  Genitourinary: Negative for dysuria, frequency and urgency.  Musculoskeletal: Negative for arthralgias, back pain and myalgias.  Skin: Negative for pallor and rash.  Allergic/Immunologic: Negative for environmental allergies.  Neurological: Negative for dizziness, syncope and headaches.  Hematological: Negative for adenopathy. Does not bruise/bleed easily.  Psychiatric/Behavioral: Negative for  decreased concentration and dysphoric mood. The patient is not nervous/anxious.        Objective:   Physical Exam  Constitutional: She appears well-developed and well-nourished. No distress.  Well appearing   HENT:  Head: Normocephalic and atraumatic.  Mouth/Throat: Oropharynx is clear and moist.  Eyes: Pupils are equal, round, and reactive to light. Conjunctivae and EOM are normal.  Neck: Normal range of motion. Neck supple. No JVD present. Carotid bruit is not present. No thyromegaly present.  Cardiovascular: Normal rate, regular  rhythm, normal heart sounds and intact distal pulses. Exam reveals no gallop.  Pulmonary/Chest: Effort normal and breath sounds normal. No respiratory distress. She has no wheezes. She has no rales.  No crackles  Abdominal: She exhibits no distension and no abdominal bruit.  Musculoskeletal: She exhibits no edema.  Lymphadenopathy:    She has no cervical adenopathy.  Neurological: She is alert. She has normal reflexes.  Skin: Skin is warm and dry. No rash noted.  Psychiatric: She has a normal mood and affect.          Assessment & Plan:   Problem List Items Addressed This Visit      Cardiovascular and Mediastinum   Essential hypertension - Primary    bp is improved with lisinopril bp in fair control at this time  BP Readings from Last 1 Encounters:  03/12/18 138/84   No changes needed Most recent labs reviewed  Disc lifstyle change with low sodium diet and exercise

## 2018-03-12 NOTE — Patient Instructions (Signed)
Blood pressure is in good control  No changes   Keep up healthy habits

## 2018-03-13 DIAGNOSIS — H524 Presbyopia: Secondary | ICD-10-CM | POA: Diagnosis not present

## 2018-04-02 ENCOUNTER — Other Ambulatory Visit: Payer: Self-pay | Admitting: *Deleted

## 2018-04-02 MED ORDER — PAROXETINE HCL 20 MG PO TABS: 20.0000 mg | ORAL_TABLET | Freq: Every morning | ORAL | 3 refills | Status: DC

## 2018-06-06 DIAGNOSIS — R69 Illness, unspecified: Secondary | ICD-10-CM | POA: Diagnosis not present

## 2018-06-06 DIAGNOSIS — N390 Urinary tract infection, site not specified: Secondary | ICD-10-CM | POA: Diagnosis not present

## 2018-06-06 DIAGNOSIS — E871 Hypo-osmolality and hyponatremia: Secondary | ICD-10-CM | POA: Diagnosis not present

## 2019-01-16 DIAGNOSIS — E871 Hypo-osmolality and hyponatremia: Secondary | ICD-10-CM | POA: Diagnosis not present

## 2019-01-16 DIAGNOSIS — N390 Urinary tract infection, site not specified: Secondary | ICD-10-CM | POA: Diagnosis not present

## 2019-01-16 DIAGNOSIS — R69 Illness, unspecified: Secondary | ICD-10-CM | POA: Diagnosis not present

## 2019-01-23 ENCOUNTER — Other Ambulatory Visit: Payer: Self-pay | Admitting: Family Medicine

## 2019-01-23 DIAGNOSIS — Z1231 Encounter for screening mammogram for malignant neoplasm of breast: Secondary | ICD-10-CM

## 2019-01-28 ENCOUNTER — Other Ambulatory Visit: Payer: Self-pay | Admitting: *Deleted

## 2019-01-28 DIAGNOSIS — N390 Urinary tract infection, site not specified: Secondary | ICD-10-CM | POA: Diagnosis not present

## 2019-01-28 DIAGNOSIS — E871 Hypo-osmolality and hyponatremia: Secondary | ICD-10-CM | POA: Diagnosis not present

## 2019-01-28 DIAGNOSIS — R69 Illness, unspecified: Secondary | ICD-10-CM | POA: Diagnosis not present

## 2019-01-28 MED ORDER — LISINOPRIL 10 MG PO TABS: 10.0000 mg | ORAL_TABLET | Freq: Every day | ORAL | 0 refills | Status: DC

## 2019-01-31 DIAGNOSIS — R69 Illness, unspecified: Secondary | ICD-10-CM | POA: Diagnosis not present

## 2019-02-15 DIAGNOSIS — E871 Hypo-osmolality and hyponatremia: Secondary | ICD-10-CM | POA: Diagnosis not present

## 2019-02-15 DIAGNOSIS — R69 Illness, unspecified: Secondary | ICD-10-CM | POA: Diagnosis not present

## 2019-02-15 DIAGNOSIS — N390 Urinary tract infection, site not specified: Secondary | ICD-10-CM | POA: Diagnosis not present

## 2019-02-17 ENCOUNTER — Telehealth: Payer: Self-pay | Admitting: Family Medicine

## 2019-02-17 DIAGNOSIS — E7800 Pure hypercholesterolemia, unspecified: Secondary | ICD-10-CM

## 2019-02-17 DIAGNOSIS — I10 Essential (primary) hypertension: Secondary | ICD-10-CM

## 2019-02-17 NOTE — Telephone Encounter (Signed)
-----   Message from Cloyd Stagers, RT sent at 02/13/2019  9:58 AM EDT ----- Regarding: Lab Orders for Monday 10.5.2020 Please place lab orders for Monday 10.5.2020, office visit for physical on Tuesday 10.6.2020 Thank you, Dyke Maes RT(R)

## 2019-02-18 ENCOUNTER — Other Ambulatory Visit (INDEPENDENT_AMBULATORY_CARE_PROVIDER_SITE_OTHER): Payer: Medicare HMO

## 2019-02-18 ENCOUNTER — Ambulatory Visit (INDEPENDENT_AMBULATORY_CARE_PROVIDER_SITE_OTHER): Payer: Medicare HMO

## 2019-02-18 DIAGNOSIS — I10 Essential (primary) hypertension: Secondary | ICD-10-CM | POA: Diagnosis not present

## 2019-02-18 DIAGNOSIS — E7800 Pure hypercholesterolemia, unspecified: Secondary | ICD-10-CM | POA: Diagnosis not present

## 2019-02-18 DIAGNOSIS — Z0000 Encounter for general adult medical examination without abnormal findings: Secondary | ICD-10-CM

## 2019-02-18 LAB — CBC WITH DIFFERENTIAL/PLATELET
Basophils Absolute: 0 10*3/uL (ref 0.0–0.1)
Basophils Relative: 0.8 % (ref 0.0–3.0)
Eosinophils Absolute: 0.1 10*3/uL (ref 0.0–0.7)
Eosinophils Relative: 2.4 % (ref 0.0–5.0)
HCT: 41.1 % (ref 36.0–46.0)
Hemoglobin: 13.6 g/dL (ref 12.0–15.0)
Lymphocytes Relative: 24.2 % (ref 12.0–46.0)
Lymphs Abs: 1.3 10*3/uL (ref 0.7–4.0)
MCHC: 33.1 g/dL (ref 30.0–36.0)
MCV: 99.4 fl (ref 78.0–100.0)
Monocytes Absolute: 0.4 10*3/uL (ref 0.1–1.0)
Monocytes Relative: 7.3 % (ref 3.0–12.0)
Neutro Abs: 3.5 10*3/uL (ref 1.4–7.7)
Neutrophils Relative %: 65.3 % (ref 43.0–77.0)
Platelets: 251 10*3/uL (ref 150.0–400.0)
RBC: 4.13 Mil/uL (ref 3.87–5.11)
RDW: 13 % (ref 11.5–15.5)
WBC: 5.3 10*3/uL (ref 4.0–10.5)

## 2019-02-18 LAB — COMPREHENSIVE METABOLIC PANEL
ALT: 20 U/L (ref 0–35)
AST: 20 U/L (ref 0–37)
Albumin: 4.3 g/dL (ref 3.5–5.2)
Alkaline Phosphatase: 55 U/L (ref 39–117)
BUN: 23 mg/dL (ref 6–23)
CO2: 26 mEq/L (ref 19–32)
Calcium: 9.1 mg/dL (ref 8.4–10.5)
Chloride: 104 mEq/L (ref 96–112)
Creatinine, Ser: 0.97 mg/dL (ref 0.40–1.20)
GFR: 57.11 mL/min — ABNORMAL LOW (ref >60.00)
Glucose, Bld: 86 mg/dL (ref 70–99)
Potassium: 4.4 mEq/L (ref 3.5–5.1)
Sodium: 137 mEq/L (ref 135–145)
Total Bilirubin: 0.7 mg/dL (ref 0.2–1.2)
Total Protein: 7 g/dL (ref 6.0–8.3)

## 2019-02-18 LAB — LIPID PANEL
Cholesterol: 197 mg/dL (ref 0–200)
HDL: 82.4 mg/dL (ref >39.00)
LDL Cholesterol: 104 mg/dL — ABNORMAL HIGH (ref 0–99)
NonHDL: 114.71
Total CHOL/HDL Ratio: 2
Triglycerides: 55 mg/dL (ref 0.0–149.0)
VLDL: 11 mg/dL (ref 0.0–40.0)

## 2019-02-18 LAB — TSH: TSH: 2.66 u[IU]/mL (ref 0.35–4.50)

## 2019-02-18 NOTE — Progress Notes (Signed)
PCP notes: none  Health Maintenance: Patient wants to receive Tdap vaccine. Patient will check with pharmacy about Shingrix vaccine.     Abnormal Screenings: none    Patient concerns: Patient states that her bladder is not fully emptying after she urinates. Onset 6 months ago.     Nurse concerns: none    Next PCP appt.: 02/19/2019 @ 10:45 am

## 2019-02-18 NOTE — Progress Notes (Signed)
Subjective:   Caitlin Perez is a 68 y.o. female who presents for Medicare Annual (Subsequent) preventive examination.  Review of Systems:    This visit is being conducted through telemedicine via telephone at the nurse health advisor's home address due to the COVID-19 pandemic. This patient has given me verbal consent via doximity to conduct this visit, patient states they are participating from their home address. Some vital signs may be absent or patient reported.    Patient identification: identified by name, DOB, and current address  Cardiac Risk Factors include: advanced age (>70men, >22 women);dyslipidemia;hypertension     Objective:     Vitals: There were no vitals taken for this visit.  There is no height or weight on file to calculate BMI.  Advanced Directives 02/18/2019  Does Patient Have a Medical Advance Directive? Yes  Type of Paramedic of [place];Living will  Copy of [place] in Chart? No - copy requested    Tobacco Social History   Tobacco Use  Smoking Status Former Smoker  Smokeless Tobacco Never Used  Tobacco Comment   over 30 years ago     Counseling given: Not Answered Comment: over 30 years ago   Clinical Intake:  Pre-visit preparation completed: Yes  Pain : No/denies pain     Nutritional Risks: None Diabetes: No  How often do you need to have someone help you when you read instructions, pamphlets, or other written materials from your doctor or pharmacy?: 1 - Never What is the last grade level you completed in school?: associates  Interpreter Needed?: No  Information entered by :: CJohnson, LPN  Past Medical History:  Diagnosis Date  . Anxiety   . Other and unspecified hyperlipidemia    Past Surgical History:  Procedure Laterality Date  . BREAST BIOPSY Right   . COLONOSCOPY  2009   Family History  Problem Relation Age of Onset  . Depression Sister   . Breast cancer Sister   .  Cancer Mother   . Ovarian cancer Maternal Aunt   . Heart disease Other        GP  . Stroke Other        GP  . Brain cancer Other        Parent  +smoker  . Anxiety disorder Sister    Social History   Socioeconomic History  . Marital status: Married    Spouse name: Not on file  . Number of children: Not on file  . Years of education: Not on file  . Highest education level: Not on file  Occupational History  . Occupation: Optometrist  Social Needs  . Financial resource strain: Not hard at all  . Food insecurity    Worry: Never true    Inability: Never true  . Transportation needs    Medical: No    Non-medical: No  Tobacco Use  . Smoking status: Former Research scientist (life sciences)  . Smokeless tobacco: Never Used  . Tobacco comment: over 30 years ago  Substance and Sexual Activity  . Alcohol use: Yes    Alcohol/week: 0.0 standard drinks    Comment: 7 drinks/week  . Drug use: No  . Sexual activity: Not on file  Lifestyle  . Physical activity    Days per week: 0 days    Minutes per session: 0 min  . Stress: Not at all  Relationships  . Social Herbalist on phone: Not on file    Gets together: Not  on file    Attends religious service: Not on file    Active member of club or organization: Not on file    Attends meetings of clubs or organizations: Not on file    Relationship status: Not on file  Other Topics Concern  . 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 lisinopril (ZESTRIL) 10 MG tablet Take 1 tablet (10 mg total) by mouth daily.  Marland Kitchen PARoxetine (PAXIL) 20 MG tablet Take 1 tablet (20 mg total) by mouth every morning.   No facility-administered encounter medications on file as of 02/18/2019.     Activities of Daily Living In your present state of health, do you have any  difficulty performing the following activities: 02/18/2019  Hearing? N  Vision? N  Difficulty concentrating or making decisions? N  Walking or climbing stairs? N  Dressing or bathing? N  Doing errands, shopping? N  Preparing Food and eating ? N  Using the Toilet? N  In the past six months, have you accidently leaked urine? N  Do you have problems with loss of bowel control? N  Managing your Medications? N  Managing your Finances? N  Housekeeping or managing your Housekeeping? N  Some recent data might be hidden    Patient Care Team: Tower, Wynelle Fanny, MD as PCP - General    Assessment:   This is a routine wellness examination for Caitlin Perez.  Exercise Activities and Dietary recommendations Current Exercise Habits: Home exercise routine, Type of exercise: walking, Time (Minutes): 30, Frequency (Times/Week): 7, Weekly Exercise (Minutes/Week): 210, Intensity: Moderate, Exercise limited by: None identified  Goals    . Patient Stated     02/18/2019, Patient wants to stay healthy enough to travel.        Fall Risk Fall Risk  02/18/2019 01/31/2018 10/26/2016  Falls in the past year? 0 No Yes  Number falls in past yr: - - 2 or more  Injury with Fall? - - No  Risk for fall due to : Medication side effect - -  Follow up Falls evaluation completed;Falls prevention discussed - (No Data)  Comment - - pt is a hiker    Is the patient's home free of loose throw rugs in walkways, pet beds, electrical cords, etc?   yes      Grab bars in the bathroom? yes      Handrails on the stairs?   yes      Adequate lighting?   yes  Timed Get Up and Go performed: n/a  Depression Screen PHQ 2/9 Scores 02/18/2019 01/31/2018 10/26/2016  PHQ - 2 Score 0 0 0  PHQ- 9 Score 0 - -     Cognitive Function MMSE - Mini Mental State Exam 02/18/2019  Orientation to time 5  Orientation to Place 5  Registration 3  Attention/ Calculation 5  Recall 3  Language- repeat 1  Mini Cog  Mini-Cog screen was completed.  Maximum score is 22. A value of 0 denotes this part of the MMSE was not completed or the patient failed this part of the Mini-Cog screening.      Immunization History  Administered Date(s) Administered  . Fluad Quad(high Dose 65+) 01/31/2019  . Influenza, High Dose Seasonal PF 02/17/2017, 01/25/2018, 01/31/2019  .  Influenza-Unspecified 02/08/2015  . Pneumococcal Conjugate-13 10/26/2016  . Pneumococcal Polysaccharide-23 01/31/2018  . Td 05/16/2005    Qualifies for Shingles Vaccine?series completed   Screening Tests Health Maintenance  Topic Date Due  . DEXA SCAN  02/29/2016  . MAMMOGRAM  11/21/2018  . Hepatitis C Screening  06/17/2020 (Originally 1951-03-17)  . TETANUS/TDAP  03/12/2021 (Originally 05/17/2015)  . COLONOSCOPY  11/26/2026  . INFLUENZA VACCINE  Completed  . PNA vac Low Risk Adult  Completed    Cancer Screenings: Lung: Low Dose CT Chest recommended if Age 70-80 years, 30 pack-year currently smoking OR have quit w/in 15years. Patient does not qualify. Breast:  Up to date on Mammogram? No, scheduled 04/01/2019   Up to date of Bone Density/Dexa? No, scheduled 04/01/2019 Colorectal: completed 11/25/2016  Additional Screenings:  Hepatitis C Screening: declined     Plan:    Patient states that she wants to stay healthy enough to travel.   I have personally reviewed and noted the following in the patient's chart:   . Medical and social history . Use of alcohol, tobacco or illicit drugs  . Current medications and supplements . Functional ability and status . Nutritional status . Physical activity . Advanced directives . List of other physicians . Hospitalizations, surgeries, and ER visits in previous 12 months . Vitals . Screenings to include cognitive, depression, and falls . Referrals and appointments  In addition, I have reviewed and discussed with patient certain preventive protocols, quality metrics, and best practice recommendations. A written  personalized care plan for preventive services as well as general preventive health recommendations were provided to patient.     Andrez Grime, LPN  [id number]

## 2019-02-18 NOTE — Patient Instructions (Signed)
Caitlin Perez , Thank you for taking time to come for your Medicare Wellness Visit. I appreciate your ongoing commitment to your health goals. Please review the following plan we discussed and let me know if I can assist you in the future.   Screening recommendations/referrals: Colonoscopy: up to date, completed 11/25/2016 Mammogram: scheduled for 04/01/2019 Bone Density: scheduled for 04/01/2019 Recommended yearly ophthalmology/optometry visit for glaucoma screening and checkup Recommended yearly dental visit for hygiene and checkup  Vaccinations: Influenza vaccine: up to date, completed 01/31/2019 Pneumococcal vaccine: series completed  Tdap vaccine: will ask doctor about receiving this vaccine Shingles vaccine: will check with pharmacy     Advanced directives: Please bring a copy of your POA (Power of [place]) and/or Living Will to your next appointment.   Conditions/risks identified: hypertension, hyperlipidemia   Next appointment: 02/19/2019 @ 10:45 am    Preventive Care 65 Years and Older, Female Preventive care refers to lifestyle choices and visits with your health care provider that can promote health and wellness. What does preventive care include?  A yearly physical exam. This is also called an annual well check.  Dental exams once or twice a year.  Routine eye exams. Ask your health care provider how often you should have your eyes checked.  Personal lifestyle choices, including:  Daily care of your teeth and gums.  Regular physical activity.  Eating a healthy diet.  Avoiding tobacco and drug use.  Limiting alcohol use.  Practicing safe sex.  Taking low-dose aspirin every day.  Taking vitamin and mineral supplements as recommended by your health care provider. What happens during an annual well check? The services and screenings done by your health care provider during your annual well check will depend on your age, overall health, lifestyle risk factors,  and family history of disease. Counseling  Your health care provider may ask you questions about your:  Alcohol use.  Tobacco use.  Drug use.  Emotional well-being.  Home and relationship well-being.  Sexual activity.  Eating habits.  History of falls.  Memory and ability to understand (cognition).  Work and work Statistician.  Reproductive health. Screening  You may have the following tests or measurements:  Height, weight, and BMI.  Blood pressure.  Lipid and cholesterol levels. These may be checked every 5 years, or more frequently if you are over 64 years old.  Skin check.  Lung cancer screening. You may have this screening every year starting at age 58 if you have a 30-pack-year history of smoking and currently smoke or have quit within the past 15 years.  Fecal occult blood test (FOBT) of the stool. You may have this test every year starting at age 31.  Flexible sigmoidoscopy or colonoscopy. You may have a sigmoidoscopy every 5 years or a colonoscopy every 10 years starting at age 95.  Hepatitis C blood test.  Hepatitis B blood test.  Sexually transmitted disease (STD) testing.  Diabetes screening. This is done by checking your blood sugar (glucose) after you have not eaten for a while (fasting). You may have this done every 1-3 years.  Bone density scan. This is done to screen for osteoporosis. You may have this done starting at age 28.  Mammogram. This may be done every 1-2 years. Talk to your health care provider about how often you should have regular mammograms. Talk with your health care provider about your test results, treatment options, and if necessary, the need for more tests. Vaccines  Your health care provider may recommend  certain vaccines, such as:  Influenza vaccine. This is recommended every year.  Tetanus, diphtheria, and acellular pertussis (Tdap, Td) vaccine. You may need a Td booster every 10 years.  Zoster vaccine. You may need  this after age 69.  Pneumococcal 13-valent conjugate (PCV13) vaccine. One dose is recommended after age 24.  Pneumococcal polysaccharide (PPSV23) vaccine. One dose is recommended after age 55. Talk to your health care provider about which screenings and vaccines you need and how often you need them. This information is not intended to replace advice given to you by your health care provider. Make sure you discuss any questions you have with your health care provider. Document Released: 05/29/2015 Document Revised: 01/20/2016 Document Reviewed: 03/03/2015 Elsevier Interactive Patient Education  2017 [place] Prevention in the Home Falls can cause injuries. They can happen to people of all ages. There are many things you can do to make your home safe and to help prevent falls. What can I do on the outside of my home?  Regularly fix the edges of walkways and driveways and fix any cracks.  Remove anything that might make you trip as you walk through a door, such as a raised step or threshold.  Trim any bushes or trees on the path to your home.  Use bright outdoor lighting.  Clear any walking paths of anything that might make someone trip, such as rocks or tools.  Regularly check to see if handrails are loose or broken. Make sure that both sides of any steps have handrails.  Any raised decks and porches should have guardrails on the edges.  Have any leaves, snow, or ice cleared regularly.  Use sand or salt on walking paths during winter.  Clean up any spills in your garage right away. This includes oil or grease spills. What can I do in the bathroom?  Use night lights.  Install grab bars by the toilet and in the tub and shower. Do not use towel bars as grab bars.  Use non-skid mats or decals in the tub or shower.  If you need to sit down in the shower, use a plastic, non-slip stool.  Keep the floor dry. Clean up any water that spills on the floor as soon as it  happens.  Remove soap buildup in the tub or shower regularly.  Attach bath mats securely with double-sided non-slip rug tape.  Do not have throw rugs and other things on the floor that can make you trip. What can I do in the bedroom?  Use night lights.  Make sure that you have a light by your bed that is easy to reach.  Do not use any sheets or blankets that are too big for your bed. They should not hang down onto the floor.  Have a firm chair that has side arms. You can use this for support while you get dressed.  Do not have throw rugs and other things on the floor that can make you trip. What can I do in the kitchen?  Clean up any spills right away.  Avoid walking on wet floors.  Keep items that you use a lot in easy-to-reach places.  If you need to reach something above you, use a strong step stool that has a grab bar.  Keep electrical cords out of the way.  Do not use floor polish or wax that makes floors slippery. If you must use wax, use non-skid floor wax.  Do not have throw rugs and other  things on the floor that can make you trip. What can I do with my stairs?  Do not leave any items on the stairs.  Make sure that there are handrails on both sides of the stairs and use them. Fix handrails that are broken or loose. Make sure that handrails are as long as the stairways.  Check any carpeting to make sure that it is firmly attached to the stairs. Fix any carpet that is loose or worn.  Avoid having throw rugs at the top or bottom of the stairs. If you do have throw rugs, attach them to the floor with carpet tape.  Make sure that you have a light switch at the top of the stairs and the bottom of the stairs. If you do not have them, ask someone to add them for you. What else can I do to help prevent falls?  Wear shoes that:  Do not have high heels.  Have rubber bottoms.  Are comfortable and fit you well.  Are closed at the toe. Do not wear sandals.  If you  use a stepladder:  Make sure that it is fully opened. Do not climb a closed stepladder.  Make sure that both sides of the stepladder are locked into place.  Ask someone to hold it for you, if possible.  Clearly mark and make sure that you can see:  Any grab bars or handrails.  First and last steps.  Where the edge of each step is.  Use tools that help you move around (mobility aids) if they are needed. These include:  Canes.  Walkers.  Scooters.  Crutches.  Turn on the lights when you go into a dark area. Replace any light bulbs as soon as they burn out.  Set up your furniture so you have a clear path. Avoid moving your furniture around.  If any of your floors are uneven, fix them.  If there are any pets around you, be aware of where they are.  Review your medicines with your doctor. Some medicines can make you feel dizzy. This can increase your chance of falling. Ask your doctor what other things that you can do to help prevent falls. This information is not intended to replace advice given to you by your health care provider. Make sure you discuss any questions you have with your health care provider. Document Released: 02/26/2009 Document Revised: 10/08/2015 Document Reviewed: 06/06/2014 Elsevier Interactive Patient Education  2017 [employer].

## 2019-02-19 ENCOUNTER — Encounter: Payer: Self-pay | Admitting: Family Medicine

## 2019-02-19 ENCOUNTER — Ambulatory Visit (INDEPENDENT_AMBULATORY_CARE_PROVIDER_SITE_OTHER): Payer: Medicare HMO | Admitting: Family Medicine

## 2019-02-19 ENCOUNTER — Other Ambulatory Visit: Payer: Self-pay

## 2019-02-19 VITALS — BP 138/80 | HR 67 | Temp 97.1°F | Ht 67.5 in | Wt 166.4 lb

## 2019-02-19 DIAGNOSIS — E7800 Pure hypercholesterolemia, unspecified: Secondary | ICD-10-CM | POA: Diagnosis not present

## 2019-02-19 DIAGNOSIS — R69 Illness, unspecified: Secondary | ICD-10-CM | POA: Diagnosis not present

## 2019-02-19 DIAGNOSIS — I10 Essential (primary) hypertension: Secondary | ICD-10-CM

## 2019-02-19 DIAGNOSIS — Z0000 Encounter for general adult medical examination without abnormal findings: Secondary | ICD-10-CM

## 2019-02-19 DIAGNOSIS — F411 Generalized anxiety disorder: Secondary | ICD-10-CM | POA: Diagnosis not present

## 2019-02-19 MED ORDER — PAROXETINE HCL 20 MG PO TABS: 20.0000 mg | ORAL_TABLET | Freq: Every morning | ORAL | 3 refills | Status: DC

## 2019-02-19 MED ORDER — LISINOPRIL 10 MG PO TABS: 10.0000 mg | ORAL_TABLET | Freq: Every day | ORAL | 3 refills | Status: DC

## 2019-02-19 NOTE — Patient Instructions (Addendum)
If you are interested in the new shingles vaccine (Shingrix) - call your local pharmacy to check on coverage and availability  If affordable, get on a wait list at your pharmacy to get the vaccine.  Also check on price of tetanus shot  Tdap is important if you will be around infants Other the Td   Try to get 1200-1500 mg of calcium per day with at least 1000 iu of vitamin D - for bone health   Work on drinking water for general health Stay active/keep exercising

## 2019-02-19 NOTE — Assessment & Plan Note (Signed)
Disc goals for lipids and reasons to control them Rev last labs with pt Rev low sat fat diet in detail Great HDL LDL still hovers around 100  Overall stable with diet control

## 2019-02-19 NOTE — Progress Notes (Signed)
Subjective:    Patient ID: Caitlin Perez, female    DOB: July 25, 1950, 68 y.o.   MRN: [id number]  HPI Here for health maintenance exam and to review chronic medical problems   Doing ok overall  Staying home  No trips   Had amw yesterday  Mentioned desire for Tdap and shingrix vaccine -will check on coverage at pharmacy  No abn screenings   Mammogram 7/19 (scheduled for 04/01/19) Self breast exam - no lumps but does not do a lot of exams  Sister had breast cancer  dexa is also scheduled for 04/01/19 No falls or fractures  Not taking D or ca   Colonoscopy 7/18   Flu shot is utd for the season   Wt Readings from Last 3 Encounters:  02/19/19 166 lb 6 oz (75.5 kg)  03/12/18 160 lb 8 oz (72.8 kg)  01/31/18 160 lb 8 oz (72.8 kg)  gained 6 lb  May be eating more processed carbs  Exercise - water fitness twice weekly , walking and gardening (not as much as she used to)  Less hiking since husband hurt his knee  Does not eat sweets 25.67 kg/m  bp is stable today  No cp or palpitations or headaches or edema  No side effects to medicines  BP Readings from Last 3 Encounters:  02/19/19 140/68  03/12/18 138/84  01/31/18 (!) 152/86     Lab Results  Component Value Date   CREATININE 0.97 02/18/2019   BUN 23 02/18/2019   NA 137 02/18/2019   K 4.4 02/18/2019   CL 104 02/18/2019   CO2 26 02/18/2019   Lab Results  Component Value Date   ALT 20 02/18/2019   AST 20 02/18/2019   ALKPHOS 55 02/18/2019   BILITOT 0.7 02/18/2019   Hyperlipidemia Lab Results  Component Value Date   CHOL 197 02/18/2019   CHOL 181 01/31/2018   CHOL 187 10/24/2016   Lab Results  Component Value Date   HDL 82.40 02/18/2019   HDL 70.00 01/31/2018   HDL 64.30 10/24/2016   Lab Results  Component Value Date   LDLCALC 104 (H) 02/18/2019   LDLCALC 92 01/31/2018   LDLCALC 111 (H) 10/24/2016   Lab Results  Component Value Date   TRIG 55.0 02/18/2019   TRIG 95.0 01/31/2018   TRIG 59.0  10/24/2016   Lab Results  Component Value Date   CHOLHDL 2 02/18/2019   CHOLHDL 3 01/31/2018   CHOLHDL 3 10/24/2016   Lab Results  Component Value Date   LDLDIRECT 119.6 10/26/2009   LDLDIRECT 143.2 01/27/2009  pretty good control with diet   Lab Results  Component Value Date   WBC 5.3 02/18/2019   HGB 13.6 02/18/2019   HCT 41.1 02/18/2019   MCV 99.4 02/18/2019   PLT 251.0 02/18/2019   Lab Results  Component Value Date   TSH 2.66 02/18/2019    Patient Active Problem List   Diagnosis Date Noted  . Medicare annual wellness visit, initial 01/31/2018  . Estrogen deficiency 01/31/2018  . Essential hypertension 01/31/2018  . Welcome to Medicare preventive visit 10/26/2016  . Colon cancer screening 10/26/2016  . Routine general medical examination at a health care facility 12/25/2012  . Encounter for routine gynecological examination 12/25/2012  . Hyperlipidemia 08/25/2009  . GENERALIZED ANXIETY DISORDER 01/27/2009   Past Medical History:  Diagnosis Date  . Anxiety   . Other and unspecified hyperlipidemia    Past Surgical History:  Procedure Laterality Date  . BREAST BIOPSY  Right   . COLONOSCOPY  2009   Social History   Tobacco Use  . Smoking status: Former Research scientist (life sciences)  . Smokeless tobacco: Never Used  . Tobacco comment: over 30 years ago  Substance Use Topics  . Alcohol use: Yes    Alcohol/week: 0.0 standard drinks    Comment: 7 drinks/week  . Drug use: No   Family History  Problem Relation Age of Onset  . Depression Sister   . Breast cancer Sister   . Cancer Mother   . Ovarian cancer Maternal Aunt   . Heart disease Other        GP  . Stroke Other        GP  . Brain cancer Other        Parent  +smoker  . Anxiety disorder Sister    Allergies  Allergen Reactions  . Codeine    No current outpatient medications on file prior to visit.   No current facility-administered medications on file prior to visit.      Review of Systems  Constitutional:  Negative for activity change, appetite change, fatigue, fever and unexpected weight change.  HENT: Negative for congestion, ear pain, rhinorrhea, sinus pressure and sore throat.   Eyes: Negative for pain, redness and visual disturbance.  Respiratory: Negative for cough, shortness of breath and wheezing.   Cardiovascular: Negative for chest pain and palpitations.  Gastrointestinal: Negative for abdominal pain, blood in stool, constipation and diarrhea.  Endocrine: Negative for polydipsia and polyuria.  Genitourinary: Negative for dysuria, frequency and urgency.  Musculoskeletal: Negative for arthralgias, back pain and myalgias.  Skin: Negative for pallor and rash.  Allergic/Immunologic: Negative for environmental allergies.  Neurological: Negative for dizziness, syncope and headaches.  Hematological: Negative for adenopathy. Does not bruise/bleed easily.  Psychiatric/Behavioral: Negative for decreased concentration and dysphoric mood. The patient is not nervous/anxious.        Objective:   Physical Exam Constitutional:      General: She is not in acute distress.    Appearance: Normal appearance. She is well-developed and normal weight. She is not ill-appearing or diaphoretic.  HENT:     Head: Normocephalic and atraumatic.     Right Ear: Tympanic membrane, ear canal and external ear normal.     Left Ear: Tympanic membrane, ear canal and external ear normal.     Nose: Nose normal. No congestion.     Mouth/Throat:     Mouth: Mucous membranes are moist.     Pharynx: Oropharynx is clear. No posterior oropharyngeal erythema.  Eyes:     General: No scleral icterus.    Extraocular Movements: Extraocular movements intact.     Conjunctiva/sclera: Conjunctivae normal.     Pupils: Pupils are equal, round, and reactive to light.  Neck:     Musculoskeletal: Normal range of motion and neck supple. No neck rigidity or muscular tenderness.     Thyroid: No thyromegaly.     Vascular: No carotid  bruit or JVD.  Cardiovascular:     Rate and Rhythm: Normal rate and regular rhythm.     Pulses: Normal pulses.     Heart sounds: Normal heart sounds. No gallop.   Pulmonary:     Effort: Pulmonary effort is normal. No respiratory distress.     Breath sounds: Normal breath sounds. No wheezing.     Comments: Good air exch Chest:     Chest wall: No tenderness.  Abdominal:     General: Bowel sounds are normal. There is no distension  or abdominal bruit.     Palpations: Abdomen is soft. There is no mass.     Tenderness: There is no abdominal tenderness.     Hernia: No hernia is present.  Genitourinary:    Comments: Breast exam: No mass, nodules, thickening, tenderness, bulging, retraction, inflamation, nipple discharge or skin changes noted.  No axillary or clavicular LA.     Musculoskeletal: Normal range of motion.        General: No tenderness.     Right lower leg: No edema.     Left lower leg: No edema.     Comments: No kyphosis   Lymphadenopathy:     Cervical: No cervical adenopathy.  Skin:    General: Skin is warm and dry.     Coloration: Skin is not pale.     Findings: No erythema or rash.     Comments: Solar lentigines diffusely Mildly tanned    Neurological:     Mental Status: She is alert. Mental status is at baseline.     Cranial Nerves: No cranial nerve deficit.     Motor: No abnormal muscle tone.     Coordination: Coordination normal.     Gait: Gait normal.     Deep Tendon Reflexes: Reflexes are normal and symmetric. Reflexes normal.  Psychiatric:        Mood and Affect: Mood normal.        Cognition and Memory: Cognition and memory normal.           Assessment & Plan:   Problem List Items Addressed This Visit      Cardiovascular and Mediastinum   Essential hypertension    bp in fair control at this time  BP Readings from Last 1 Encounters:  02/19/19 138/80   No changes needed Most recent labs reviewed  Disc lifstyle change with low sodium diet and  exercise  Pt does have some white coat effect as well      Relevant Medications   lisinopril (ZESTRIL) 10 MG tablet     Other   Hyperlipidemia    Disc goals for lipids and reasons to control them Rev last labs with pt Rev low sat fat diet in detail Great HDL LDL still hovers around 100  Overall stable with diet control      Relevant Medications   lisinopril (ZESTRIL) 10 MG tablet   GENERALIZED ANXIETY DISORDER    Pt does well as long as she continues paroxetine Mood is good today Good self care and outdoor time      Relevant Medications   PARoxetine (PAXIL) 20 MG tablet   Routine general medical examination at a health care facility - Primary    Reviewed health habits including diet and exercise and skin cancer prevention Reviewed appropriate screening tests for age  Also reviewed health mt list, fam hx and immunization status , as well as social and family history    See HPI Labs reviewed Pt plans to check on coverage of tetanus shot and shingrix at the pharmacy  Enc her to keep exercising  Mammogram and dexa planned for November

## 2019-02-19 NOTE — Assessment & Plan Note (Signed)
Reviewed health habits including diet and exercise and skin cancer prevention Reviewed appropriate screening tests for age  Also reviewed health mt list, fam hx and immunization status , as well as social and family history    See HPI Labs reviewed Pt plans to check on coverage of tetanus shot and shingrix at the pharmacy  Enc her to keep exercising  Mammogram and dexa planned for November

## 2019-02-19 NOTE — Assessment & Plan Note (Signed)
Pt does well as long as she continues paroxetine Mood is good today Good self care and outdoor time

## 2019-02-19 NOTE — Assessment & Plan Note (Signed)
bp in fair control at this time  BP Readings from Last 1 Encounters:  02/19/19 138/80   No changes needed Most recent labs reviewed  Disc lifstyle change with low sodium diet and exercise  Pt does have some white coat effect as well

## 2019-04-01 ENCOUNTER — Ambulatory Visit
Admission: RE | Admit: 2019-04-01 | Discharge: 2019-04-01 | Disposition: A | Discharge status: Home / Self Care | Payer: Medicare HMO | Source: Ambulatory Visit | Attending: Family Medicine | Admitting: Family Medicine

## 2019-04-01 ENCOUNTER — Other Ambulatory Visit: Payer: Self-pay

## 2019-04-01 DIAGNOSIS — M8589 Other specified disorders of bone density and structure, multiple sites: Secondary | ICD-10-CM | POA: Diagnosis not present

## 2019-04-01 DIAGNOSIS — Z780 Asymptomatic menopausal state: Secondary | ICD-10-CM | POA: Diagnosis not present

## 2019-04-01 DIAGNOSIS — E2839 Other primary ovarian failure: Secondary | ICD-10-CM

## 2019-04-01 DIAGNOSIS — Z1231 Encounter for screening mammogram for malignant neoplasm of breast: Secondary | ICD-10-CM

## 2019-04-02 ENCOUNTER — Encounter: Payer: Self-pay | Admitting: Family Medicine

## 2019-04-02 ENCOUNTER — Encounter: Payer: Self-pay | Admitting: *Deleted

## 2019-04-02 DIAGNOSIS — M8580 Other specified disorders of bone density and structure, unspecified site: Secondary | ICD-10-CM | POA: Insufficient documentation

## 2019-05-22 DIAGNOSIS — R69 Illness, unspecified: Secondary | ICD-10-CM | POA: Diagnosis not present

## 2019-05-22 DIAGNOSIS — M170 Bilateral primary osteoarthritis of knee: Secondary | ICD-10-CM | POA: Diagnosis not present

## 2019-05-22 DIAGNOSIS — S83281A Other tear of lateral meniscus, current injury, right knee, initial encounter: Secondary | ICD-10-CM | POA: Diagnosis not present

## 2019-05-27 DIAGNOSIS — R69 Illness, unspecified: Secondary | ICD-10-CM | POA: Diagnosis not present

## 2019-05-27 DIAGNOSIS — M170 Bilateral primary osteoarthritis of knee: Secondary | ICD-10-CM | POA: Diagnosis not present

## 2019-05-27 DIAGNOSIS — S83281A Other tear of lateral meniscus, current injury, right knee, initial encounter: Secondary | ICD-10-CM | POA: Diagnosis not present

## 2019-09-20 DIAGNOSIS — S83281A Other tear of lateral meniscus, current injury, right knee, initial encounter: Secondary | ICD-10-CM | POA: Diagnosis not present

## 2019-09-20 DIAGNOSIS — R69 Illness, unspecified: Secondary | ICD-10-CM | POA: Diagnosis not present

## 2019-09-20 DIAGNOSIS — M170 Bilateral primary osteoarthritis of knee: Secondary | ICD-10-CM | POA: Diagnosis not present

## 2019-11-20 DIAGNOSIS — Z803 Family history of malignant neoplasm of breast: Secondary | ICD-10-CM | POA: Diagnosis not present

## 2019-11-20 DIAGNOSIS — F419 Anxiety disorder, unspecified: Secondary | ICD-10-CM | POA: Diagnosis not present

## 2019-11-20 DIAGNOSIS — Z809 Family history of malignant neoplasm, unspecified: Secondary | ICD-10-CM | POA: Diagnosis not present

## 2019-11-20 DIAGNOSIS — Z8249 Family history of ischemic heart disease and other diseases of the circulatory system: Secondary | ICD-10-CM | POA: Diagnosis not present

## 2019-11-20 DIAGNOSIS — R69 Illness, unspecified: Secondary | ICD-10-CM | POA: Diagnosis not present

## 2019-11-20 DIAGNOSIS — Z87891 Personal history of nicotine dependence: Secondary | ICD-10-CM | POA: Diagnosis not present

## 2019-11-20 DIAGNOSIS — I10 Essential (primary) hypertension: Secondary | ICD-10-CM | POA: Diagnosis not present

## 2019-11-20 DIAGNOSIS — Z791 Long term (current) use of non-steroidal anti-inflammatories (NSAID): Secondary | ICD-10-CM | POA: Diagnosis not present

## 2019-12-02 DIAGNOSIS — H524 Presbyopia: Secondary | ICD-10-CM | POA: Diagnosis not present

## 2020-01-21 DIAGNOSIS — M170 Bilateral primary osteoarthritis of knee: Secondary | ICD-10-CM | POA: Diagnosis not present

## 2020-01-21 DIAGNOSIS — R69 Illness, unspecified: Secondary | ICD-10-CM | POA: Diagnosis not present

## 2020-01-21 DIAGNOSIS — S83281A Other tear of lateral meniscus, current injury, right knee, initial encounter: Secondary | ICD-10-CM | POA: Diagnosis not present

## 2020-02-25 ENCOUNTER — Telehealth: Payer: Self-pay | Admitting: Family Medicine

## 2020-02-25 DIAGNOSIS — E7800 Pure hypercholesterolemia, unspecified: Secondary | ICD-10-CM

## 2020-02-25 DIAGNOSIS — Z0000 Encounter for general adult medical examination without abnormal findings: Secondary | ICD-10-CM

## 2020-02-25 DIAGNOSIS — I10 Essential (primary) hypertension: Secondary | ICD-10-CM

## 2020-02-25 NOTE — Telephone Encounter (Signed)
-----   Message from Cloyd Stagers, RT sent at 02/11/2020  3:24 PM EDT ----- Regarding: Lab Orders for Wednesday 10.13.2021 Please place lab orders for Wednesday 10.13.2021, office visit for physical on Monday 10.18.2021 Thank you, Dyke Maes RT(R)

## 2020-02-26 ENCOUNTER — Other Ambulatory Visit (INDEPENDENT_AMBULATORY_CARE_PROVIDER_SITE_OTHER): Payer: Medicare HMO

## 2020-02-26 ENCOUNTER — Other Ambulatory Visit: Payer: Self-pay

## 2020-02-26 ENCOUNTER — Other Ambulatory Visit: Payer: Self-pay | Admitting: Family Medicine

## 2020-02-26 DIAGNOSIS — I10 Essential (primary) hypertension: Secondary | ICD-10-CM | POA: Diagnosis not present

## 2020-02-26 DIAGNOSIS — E7800 Pure hypercholesterolemia, unspecified: Secondary | ICD-10-CM | POA: Diagnosis not present

## 2020-02-26 LAB — CBC WITH DIFFERENTIAL/PLATELET
Basophils Absolute: 0 10*3/uL (ref 0.0–0.1)
Basophils Relative: 0.7 % (ref 0.0–3.0)
Eosinophils Absolute: 0.1 10*3/uL (ref 0.0–0.7)
Eosinophils Relative: 2.3 % (ref 0.0–5.0)
HCT: 41.1 % (ref 36.0–46.0)
Hemoglobin: 13.9 g/dL (ref 12.0–15.0)
Lymphocytes Relative: 25.1 % (ref 12.0–46.0)
Lymphs Abs: 1.2 10*3/uL (ref 0.7–4.0)
MCHC: 33.7 g/dL (ref 30.0–36.0)
MCV: 98 fl (ref 78.0–100.0)
Monocytes Absolute: 0.4 10*3/uL (ref 0.1–1.0)
Monocytes Relative: 7.7 % (ref 3.0–12.0)
Neutro Abs: 3 10*3/uL (ref 1.4–7.7)
Neutrophils Relative %: 64.2 % (ref 43.0–77.0)
Platelets: 241 10*3/uL (ref 150.0–400.0)
RBC: 4.19 Mil/uL (ref 3.87–5.11)
RDW: 13 % (ref 11.5–15.5)
WBC: 4.7 10*3/uL (ref 4.0–10.5)

## 2020-02-26 LAB — COMPREHENSIVE METABOLIC PANEL
ALT: 17 U/L (ref 0–35)
AST: 17 U/L (ref 0–37)
Albumin: 4.2 g/dL (ref 3.5–5.2)
Alkaline Phosphatase: 54 U/L (ref 39–117)
BUN: 19 mg/dL (ref 6–23)
CO2: 31 mEq/L (ref 19–32)
Calcium: 9.3 mg/dL (ref 8.4–10.5)
Chloride: 104 mEq/L (ref 96–112)
Creatinine, Ser: 1.02 mg/dL (ref 0.40–1.20)
GFR: 56.08 mL/min — ABNORMAL LOW (ref >60.00)
Glucose, Bld: 96 mg/dL (ref 70–99)
Potassium: 4.5 mEq/L (ref 3.5–5.1)
Sodium: 141 mEq/L (ref 135–145)
Total Bilirubin: 0.6 mg/dL (ref 0.2–1.2)
Total Protein: 6.6 g/dL (ref 6.0–8.3)

## 2020-02-26 LAB — LIPID PANEL
Cholesterol: 206 mg/dL — ABNORMAL HIGH (ref 0–200)
HDL: 80.4 mg/dL (ref >39.00)
LDL Cholesterol: 113 mg/dL — ABNORMAL HIGH (ref 0–99)
NonHDL: 125.46
Total CHOL/HDL Ratio: 3
Triglycerides: 63 mg/dL (ref 0.0–149.0)
VLDL: 12.6 mg/dL (ref 0.0–40.0)

## 2020-02-26 LAB — TSH: TSH: 3.19 u[IU]/mL (ref 0.35–4.50)

## 2020-03-02 ENCOUNTER — Other Ambulatory Visit: Payer: Self-pay

## 2020-03-02 ENCOUNTER — Encounter: Payer: Self-pay | Admitting: Family Medicine

## 2020-03-02 ENCOUNTER — Ambulatory Visit (INDEPENDENT_AMBULATORY_CARE_PROVIDER_SITE_OTHER): Payer: Medicare HMO | Admitting: Family Medicine

## 2020-03-02 VITALS — BP 134/78 | HR 85 | Temp 96.5°F | Ht 67.32 in | Wt 163.7 lb

## 2020-03-02 DIAGNOSIS — I10 Essential (primary) hypertension: Secondary | ICD-10-CM | POA: Diagnosis not present

## 2020-03-02 DIAGNOSIS — R69 Illness, unspecified: Secondary | ICD-10-CM | POA: Diagnosis not present

## 2020-03-02 DIAGNOSIS — E7800 Pure hypercholesterolemia, unspecified: Secondary | ICD-10-CM | POA: Diagnosis not present

## 2020-03-02 DIAGNOSIS — M8580 Other specified disorders of bone density and structure, unspecified site: Secondary | ICD-10-CM

## 2020-03-02 DIAGNOSIS — F411 Generalized anxiety disorder: Secondary | ICD-10-CM

## 2020-03-02 DIAGNOSIS — Z0000 Encounter for general adult medical examination without abnormal findings: Secondary | ICD-10-CM

## 2020-03-02 DIAGNOSIS — Z23 Encounter for immunization: Secondary | ICD-10-CM | POA: Diagnosis not present

## 2020-03-02 NOTE — Patient Instructions (Addendum)
Please schedule your mammogram before you get your covid shot   Flu shot today   You can schedule a covid booster any time  COVID-19 Vaccine Information can be found at: [email or web address].uk For questions related to vaccine distribution or appointments, please email [email or web address] or call [phone].    If you are interested in the new shingles vaccine (Shingrix) - call your local pharmacy to check on coverage and availability  If affordable, get on a wait list at your pharmacy to get the vaccine.  Try to get 1200-1500 mg of calcium per day with at least 1000 iu of vitamin D - for bone health   For cholesterol  Avoid red meat/ fried foods/ egg yolks/ fatty breakfast meats/ butter, cheese and high fat dairy/ and shellfish

## 2020-03-02 NOTE — Progress Notes (Signed)
Subjective:    Patient ID: Caitlin Perez, female    DOB: 11/15/1950, 69 y.o.   MRN: [id number]  This visit occurred during the SARS-CoV-2 public health emergency.  Safety protocols were in place, including screening questions prior to the visit, additional usage of staff PPE, and extensive cleaning of exam room while observing appropriate contact time as indicated for disinfecting solutions.    HPI Pt presents for amw and health mt exam with rev of chronic medical problems   I have personally reviewed the Medicare Annual Wellness questionnaire and have noted 1. The patient's medical and social history 2. Their use of alcohol, tobacco or illicit drugs 3. Their current medications and supplements 4. The patient's functional ability including ADL's, fall risks, home safety risks and hearing or visual             impairment. 5. Diet and physical activities 6. Evidence for depression or mood disorders  The patients weight, height, BMI have been recorded in the chart and visual acuity is per eye clinic.  I have made referrals, counseling and provided education to the patient based review of the above and I have provided the pt with a written personalized care plan for preventive services. Reviewed and updated provider list, see scanned forms.  See scanned forms.  Routine anticipatory guidance given to patient.  See health maintenance. Colon cancer screening  Colonoscopy 7/18  Breast cancer screening   Mammogram 11/20 Self breast exam-no lumps  Sister had breast cancer and M aunt ovarian cancer  Flu vaccine- today Tetanus vaccine Tdap 10/20 covid status -pfizer  Pneumovax completed Zoster vaccine-wants to get shingrix  Dexa 11/20 -osteopenia in [place]- fell when [place] / tripped over a root (no injury)  Fractures-none  Supplements- has run out of ca/D will get more  Exercise -plenty/ hiking   Advance directive- up to date /brought copy Cognitive function addressed- see  scanned forms- and if abnormal then additional documentation follows.   PMH and SH reviewed  Meds, vitals, and allergies reviewed.   ROS: See HPI.  Otherwise negative.    Weight : Wt Readings from Last 3 Encounters:  02/19/19 166 lb 6 oz (75.5 kg)  03/12/18 160 lb 8 oz (72.8 kg)  01/31/18 160 lb 8 oz (72.8 kg)   25.39 kg/m   A good year-lot of traveling to see family/etc  Feels very good  Wakes up early and goes to bed early    Hearing/vision:  Hearing Screening   125Hz  250Hz  500Hz  1000Hz  2000Hz  3000Hz  4000Hz  6000Hz  8000Hz   Right ear:   40 40 40  40    Left ear:   40 40 40  40      Visual Acuity Screening   Right eye Left eye Both eyes  Without correction:     With correction: 20/20 20/20 20/20      Care team: Nisha Dhami-PCP [facility]  HT[facility]  Takes lisinopril 10 mg daily  bp is stable today  No cp or palpitations or headaches or edema  No side effects to medicines  BP Readings from Last 3 Encounters:  03/02/20 134/78  02/19/19 138/80  03/12/18 138/84      Gen anx disorder  Takes paxil 20 mg daily   Hyperlipidemia  Lab Results  Component Value Date   CHOL 206 (H) 02/26/2020   CHOL 197 02/18/2019   CHOL 181 01/31/2018   Lab Results  Component Value Date   HDL 80.40 02/26/2020   HDL 82.40 02/18/2019  HDL 70.00 01/31/2018   Lab Results  Component Value Date   LDLCALC 113 (H) 02/26/2020   LDLCALC 104 (H) 02/18/2019   LDLCALC 92 01/31/2018   Lab Results  Component Value Date   TRIG 63.0 02/26/2020   TRIG 55.0 02/18/2019   TRIG 95.0 01/31/2018   Lab Results  Component Value Date   CHOLHDL 3 02/26/2020   CHOLHDL 2 02/18/2019   CHOLHDL 3 01/31/2018   Lab Results  Component Value Date   LDLDIRECT 119.6 10/26/2009   LDLDIRECT 143.2 01/27/2009   LDL up a little Great HDL  Gave up white food as a rule   Other labs Results for orders placed or performed in visit on 02/26/20  TSH  Result Value Ref Range   TSH 3.19  0.35 - 4.50 uIU/mL  Lipid panel  Result Value Ref Range   Cholesterol 206 (H) 0 - 200 mg/dL   Triglycerides 63.0 0 - 149 mg/dL   HDL 80.40 >39.00 mg/dL   VLDL 12.6 0.0 - 40.0 mg/dL   LDL Cholesterol 113 (H) 0 - 99 mg/dL   Total CHOL/HDL Ratio 3    NonHDL 125.46   Comprehensive metabolic panel  Result Value Ref Range   Sodium 141 135 - 145 mEq/L   Potassium 4.5 3.5 - 5.1 mEq/L   Chloride 104 96 - 112 mEq/L   CO2 31 19 - 32 mEq/L   Glucose, Bld 96 70 - 99 mg/dL   BUN 19 6 - 23 mg/dL   Creatinine, Ser 1.02 0.40 - 1.20 mg/dL   Total Bilirubin 0.6 0.2 - 1.2 mg/dL   Alkaline Phosphatase 54 39 - 117 U/L   AST 17 0 - 37 U/L   ALT 17 0 - 35 U/L   Total Protein 6.6 6.0 - 8.3 g/dL   Albumin 4.2 3.5 - 5.2 g/dL   GFR 56.08 (L) >60.00 mL/min   Calcium 9.3 8.4 - 10.5 mg/dL  CBC with Differential/Platelet  Result Value Ref Range   WBC 4.7 4.0 - 10.5 K/uL   RBC 4.19 3.87 - 5.11 Mil/uL   Hemoglobin 13.9 12.0 - 15.0 g/dL   HCT 41.1 36 - 46 %   MCV 98.0 78.0 - 100.0 fl   MCHC 33.7 30.0 - 36.0 g/dL   RDW 13.0 11.5 - 15.5 %   Platelets 241.0 150 - 400 K/uL   Neutrophils Relative % 64.2 43 - 77 %   Lymphocytes Relative 25.1 12 - 46 %   Monocytes Relative 7.7 3 - 12 %   Eosinophils Relative 2.3 0 - 5 %   Basophils Relative 0.7 0 - 3 %   Neutro Abs 3.0 1.4 - 7.7 K/uL   Lymphs Abs 1.2 0.7 - 4.0 K/uL   Monocytes Absolute 0.4 0.1 - 1.0 K/uL   Eosinophils Absolute 0.1 0.0 - 0.7 K/uL   Basophils Absolute 0.0 0.0 - 0.1 K/uL    Patient Active Problem List   Diagnosis Date Noted  . Osteopenia 04/02/2019  . Medicare annual wellness visit, subsequent 01/31/2018  . Estrogen deficiency 01/31/2018  . Essential hypertension 01/31/2018  . Colon cancer screening 10/26/2016  . Routine general medical examination at a health care facility 12/25/2012  . Encounter for routine gynecological examination 12/25/2012  . Hyperlipidemia 08/25/2009  . GENERALIZED ANXIETY DISORDER 01/27/2009   Past Medical  History:  Diagnosis Date  . Anxiety   . Other and unspecified hyperlipidemia    Past Surgical History:  Procedure Laterality Date  . BREAST BIOPSY Right   .  COLONOSCOPY  2009   Social History   Tobacco Use  . Smoking status: Former Research scientist (life sciences)  . Smokeless tobacco: Never Used  . Tobacco comment: over 30 years ago  Vaping Use  . Vaping Use: Never used  Substance Use Topics  . Alcohol use: Yes    Alcohol/week: 0.0 standard drinks    Comment: 7 drinks/week  . Drug use: No   Family History  Problem Relation Age of Onset  . Depression Sister   . Breast cancer Sister   . Cancer Mother   . Ovarian cancer Maternal Aunt   . Heart disease Other        GP  . Stroke Other        GP  . Brain cancer Other        Parent  +smoker  . Anxiety disorder Sister    Allergies  Allergen Reactions  . Codeine    Current Outpatient Medications on File Prior to Visit  Medication Sig Dispense Refill  . lisinopril (ZESTRIL) 10 MG tablet TAKE 1 TABLET BY MOUTH EVERY DAY 90 tablet 1  . PARoxetine (PAXIL) 20 MG tablet TAKE 1 TABLET BY MOUTH EVERY DAY IN THE MORNING 90 tablet 1   No current facility-administered medications on file prior to visit.     Review of Systems  Constitutional: Negative for activity change, appetite change, fatigue, fever and unexpected weight change.  HENT: Negative for congestion, ear pain, rhinorrhea, sinus pressure and sore throat.   Eyes: Negative for pain, redness and visual disturbance.  Respiratory: Negative for cough, shortness of breath and wheezing.   Cardiovascular: Negative for chest pain and palpitations.  Gastrointestinal: Negative for abdominal pain, blood in stool, constipation and diarrhea.  Endocrine: Negative for polydipsia and polyuria.  Genitourinary: Negative for dysuria, frequency and urgency.  Musculoskeletal: Negative for arthralgias, back pain and myalgias.  Skin: Negative for pallor and rash.  Allergic/Immunologic: Negative for environmental  allergies.  Neurological: Negative for dizziness, syncope and headaches.  Hematological: Negative for adenopathy. Does not bruise/bleed easily.  Psychiatric/Behavioral: Negative for decreased concentration and dysphoric mood. The patient is not nervous/anxious.        Objective:   Physical Exam Constitutional:      General: She is not in acute distress.    Appearance: Normal appearance. She is well-developed and normal weight. She is not ill-appearing or diaphoretic.  HENT:     Head: Normocephalic and atraumatic.     Right Ear: Tympanic membrane, ear canal and external ear normal.     Left Ear: Tympanic membrane, ear canal and external ear normal.     Nose: Nose normal. No congestion.     Mouth/Throat:     Mouth: Mucous membranes are moist.     Pharynx: Oropharynx is clear. No posterior oropharyngeal erythema.  Eyes:     General: No scleral icterus.    Extraocular Movements: Extraocular movements intact.     Conjunctiva/sclera: Conjunctivae normal.     Pupils: Pupils are equal, round, and reactive to light.  Neck:     Thyroid: No thyromegaly.     Vascular: No carotid bruit or JVD.  Cardiovascular:     Rate and Rhythm: Normal rate and regular rhythm.     Pulses: Normal pulses.     Heart sounds: Normal heart sounds. No gallop.   Pulmonary:     Effort: Pulmonary effort is normal. No respiratory distress.     Breath sounds: Normal breath sounds. No wheezing.     Comments: Good  air exch Chest:     Chest wall: No tenderness.  Abdominal:     General: Bowel sounds are normal. There is no distension or abdominal bruit.     Palpations: Abdomen is soft. There is no mass.     Tenderness: There is no abdominal tenderness.     Hernia: No hernia is present.  Genitourinary:    Comments: Breast exam: No mass, nodules, thickening, tenderness, bulging, retraction, inflamation, nipple discharge or skin changes noted.  No axillary or clavicular LA.     Musculoskeletal:        General: No  tenderness. Normal range of motion.     Cervical back: Normal range of motion and neck supple. No rigidity. No muscular tenderness.     Right lower leg: No edema.     Left lower leg: No edema.     Comments: No kyphosis   Lymphadenopathy:     Cervical: No cervical adenopathy.  Skin:    General: Skin is warm and dry.     Coloration: Skin is not pale.     Findings: No erythema or rash.     Comments: Solar lentigines diffusely   Neurological:     Mental Status: She is alert. Mental status is at baseline.     Cranial Nerves: No cranial nerve deficit.     Motor: No abnormal muscle tone.     Coordination: Coordination normal.     Gait: Gait normal.     Deep Tendon Reflexes: Reflexes are normal and symmetric. Reflexes normal.  Psychiatric:        Mood and Affect: Mood normal.        Cognition and Memory: Cognition and memory normal.           Assessment & Plan:   Problem List Items Addressed This Visit      Cardiovascular and Mediastinum   Essential hypertension    bp in fair control at this time  BP Readings from Last 1 Encounters:  03/02/20 134/78   No changes needed Plan to continue lisinopril 10 mg daily Most recent labs reviewed  Disc lifstyle change with low sodium diet and exercise          Musculoskeletal and Integument   Osteopenia    Rev dexa 11/20  No falls or fractures  Enc her to take ca and vit D Good exercise  Re check in a year         Other   Hyperlipidemia    Disc goals for lipids and reasons to control them Rev last labs with pt Rev low sat fat diet in detail LDL up slightly  Given info on low cholesterol diet        GENERALIZED ANXIETY DISORDER    Continues to do well as long as she takes paroxetine  Encouraged self care      Routine general medical examination at a health care facility - Primary    Reviewed health habits including diet and exercise and skin cancer prevention Reviewed appropriate screening tests for age  Also  reviewed health mt list, fam hx and immunization status , as well as social and family history   See HPI  Given contact info to schedule her mammogram (after her covid booster) Discussed the shingrix vaccine Flu shot today  Plans on covid booster  utd dexa and no falls or fx Advance directive utd and brought copy No cognitive concerns  Nl hearing screen  Nl vision screen  Medicare annual wellness visit, subsequent    Reviewed health habits including diet and exercise and skin cancer prevention Reviewed appropriate screening tests for age  Also reviewed health mt list, fam hx and immunization status , as well as social and family history   See HPI  Given contact info to schedule her mammogram (after her covid booster) Discussed the shingrix vaccine Flu shot today  Plans on covid booster  utd dexa and no falls or fx Advance directive utd and brought copy No cognitive concerns  Nl hearing screen  Nl vision screen         Other Visit Diagnoses    Needs flu shot       Relevant Orders   Flu Vaccine QUAD High Dose(Fluad) (Completed)

## 2020-03-02 NOTE — Assessment & Plan Note (Signed)
Rev dexa 11/20  No falls or fractures  Enc her to take ca and vit D Good exercise  Re check in a year

## 2020-03-02 NOTE — Assessment & Plan Note (Signed)
Continues to do well as long as she takes paroxetine  Encouraged self care

## 2020-03-02 NOTE — Assessment & Plan Note (Signed)
Reviewed health habits including diet and exercise and skin cancer prevention Reviewed appropriate screening tests for age  Also reviewed health mt list, fam hx and immunization status , as well as social and family history   See HPI  Given contact info to schedule her mammogram (after her covid booster) Discussed the shingrix vaccine Flu shot today  Plans on covid booster  utd dexa and no falls or fx Advance directive utd and brought copy No cognitive concerns  Nl hearing screen  Nl vision screen

## 2020-03-02 NOTE — Assessment & Plan Note (Signed)
bp in fair control at this time  BP Readings from Last 1 Encounters:  03/02/20 134/78   No changes needed Plan to continue lisinopril 10 mg daily Most recent labs reviewed  Disc lifstyle change with low sodium diet and exercise

## 2020-03-02 NOTE — Assessment & Plan Note (Signed)
Disc goals for lipids and reasons to control them Rev last labs with pt Rev low sat fat diet in detail LDL up slightly  Given info on low cholesterol diet

## 2020-03-05 ENCOUNTER — Other Ambulatory Visit: Payer: Self-pay | Admitting: Family Medicine

## 2020-03-05 DIAGNOSIS — Z1231 Encounter for screening mammogram for malignant neoplasm of breast: Secondary | ICD-10-CM

## 2020-04-07 ENCOUNTER — Ambulatory Visit
Admission: RE | Admit: 2020-04-07 | Discharge: 2020-04-07 | Disposition: A | Discharge status: Home / Self Care | Payer: Medicare HMO | Source: Ambulatory Visit | Attending: Family Medicine | Admitting: Family Medicine

## 2020-04-07 ENCOUNTER — Other Ambulatory Visit: Payer: Self-pay

## 2020-04-07 DIAGNOSIS — Z1231 Encounter for screening mammogram for malignant neoplasm of breast: Secondary | ICD-10-CM

## 2020-08-28 ENCOUNTER — Other Ambulatory Visit: Payer: Self-pay | Admitting: Family Medicine

## 2020-10-04 IMAGING — MG DIGITAL SCREENING BILAT W/ TOMO W/ CAD
8 series · 8 of 24 positions shown · non-contrast
Comparison: Previous exam(s).

CLINICAL DATA: Screening.

EXAM:
DIGITAL SCREENING BILATERAL MAMMOGRAM WITH TOMO AND CAD

[R CC synth-2D]
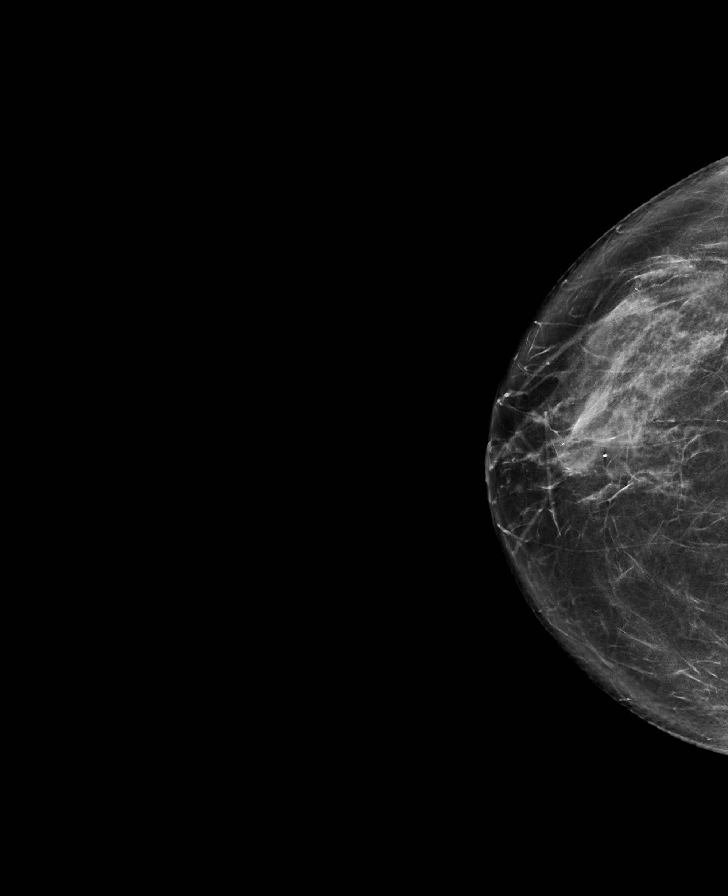

[L CC synth-2D]
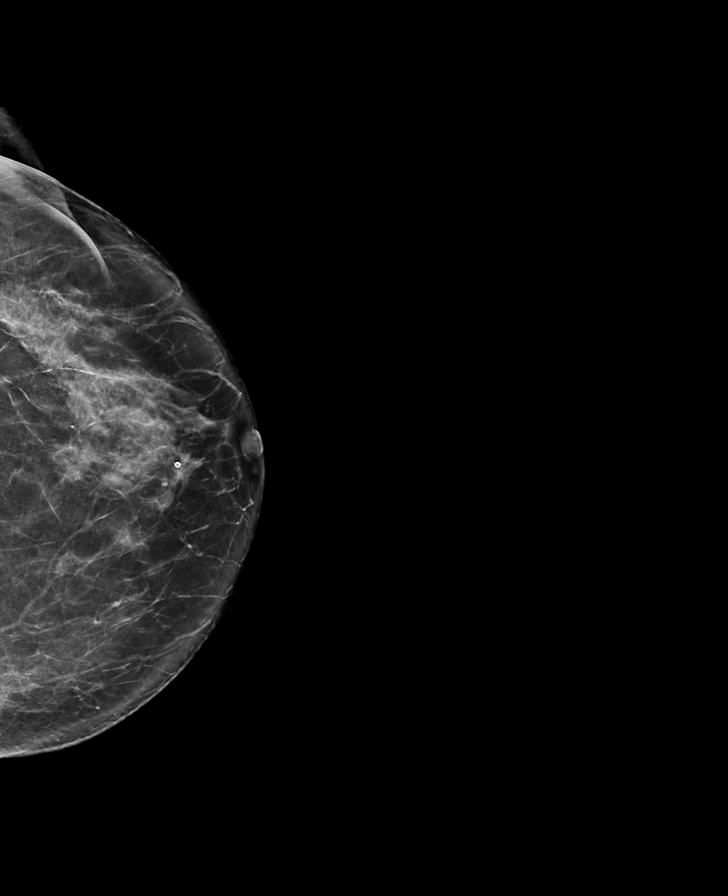

[L MLO synth-2D]
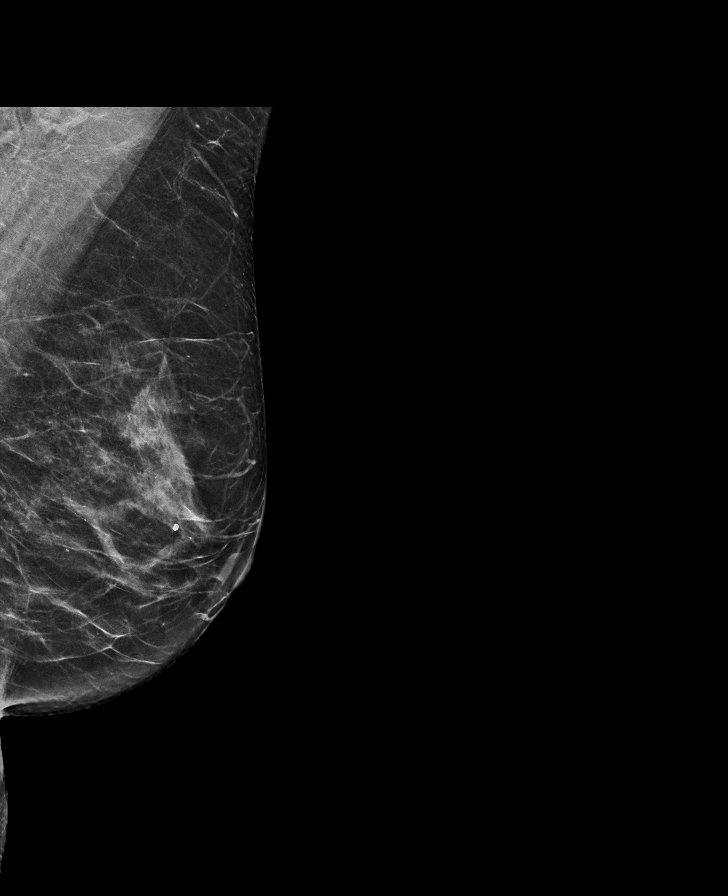

[R MLO synth-2D]
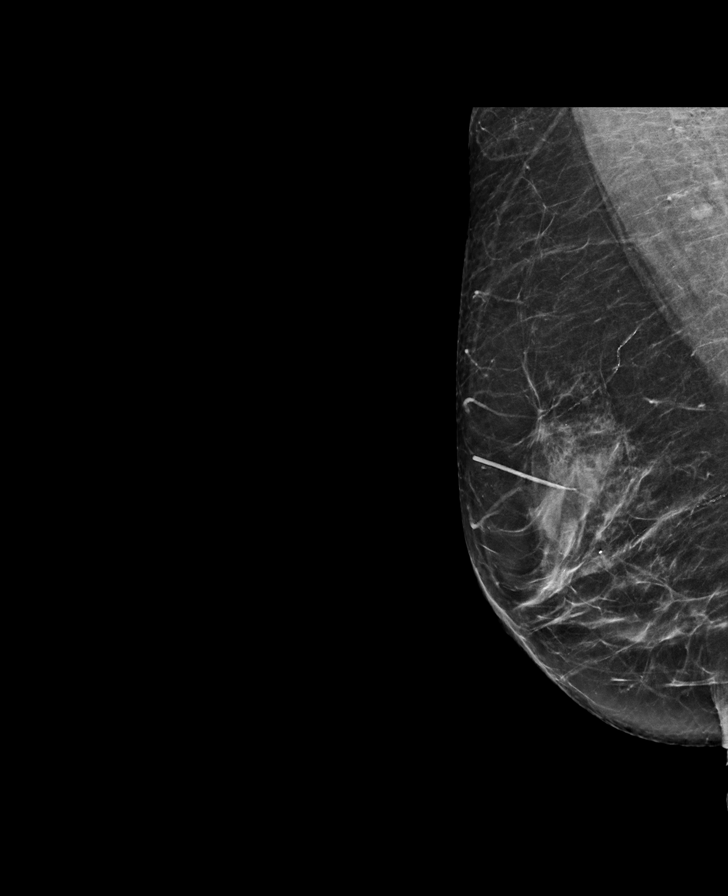

[R CC tomo · tomo slice 36/71.0]
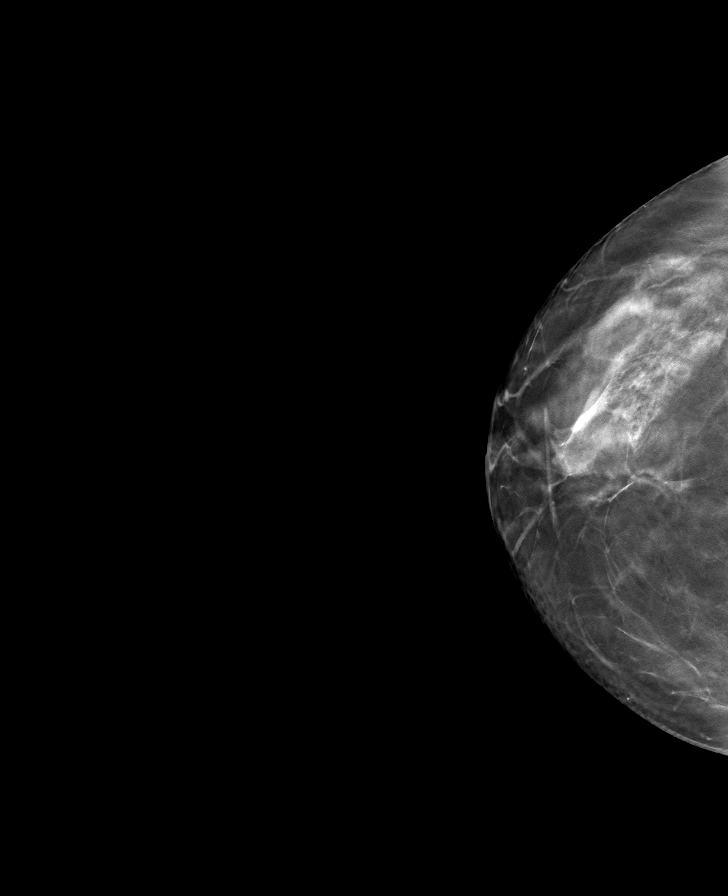

[R MLO tomo · tomo slice 35/68.0]
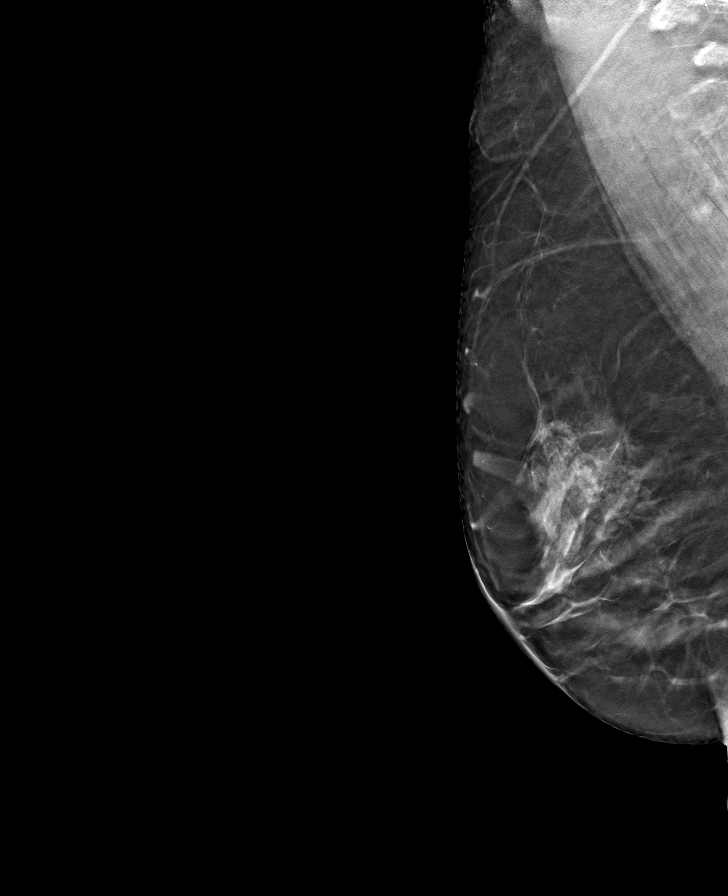

[L MLO tomo · tomo slice 34/67.0]
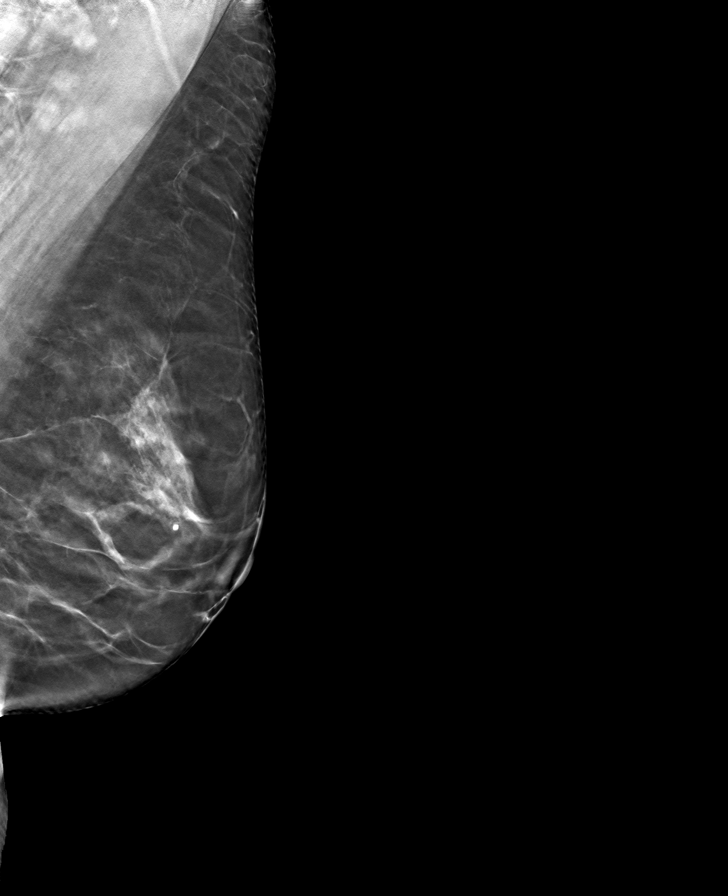

[L CC tomo · tomo slice 39/76.0]
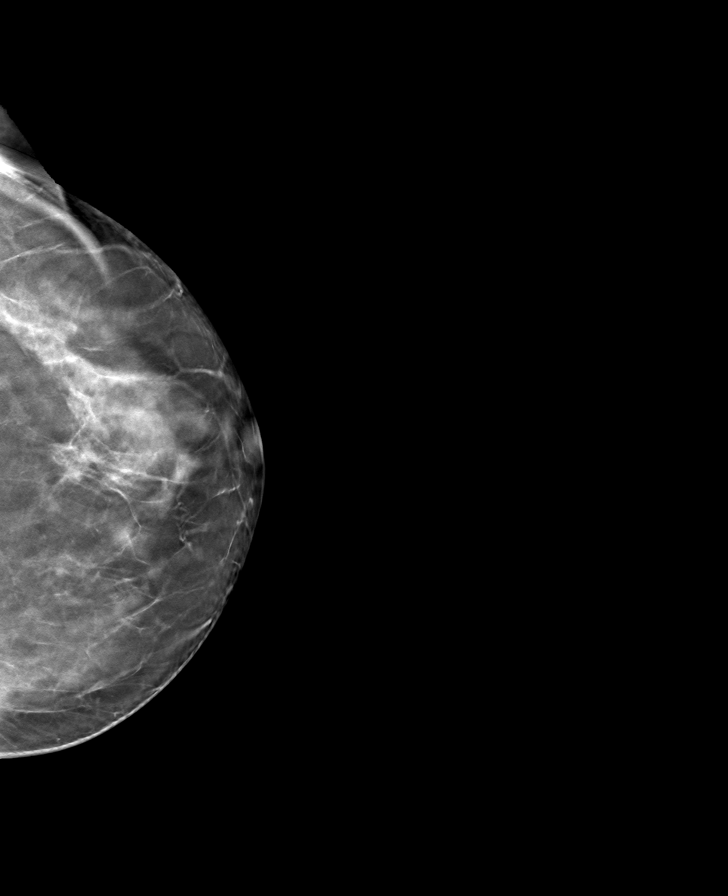

[8 of 24 positions shown; findings below may reference images not displayed]

ACR Breast Density Category c: The breast tissue is heterogeneously
dense, which may obscure small masses.
FINDINGS: There are no findings suspicious for malignancy. Images were
processed with CAD.
IMPRESSION: No mammographic evidence of malignancy. A result letter of this
screening mammogram will be mailed directly to the patient.

RECOMMENDATION:
Screening mammogram in one year. (Code:[id number]-U-LHB)

BI-RADS CATEGORY  1: Negative.

## 2020-11-14 DIAGNOSIS — W57XXXA Bitten or stung by nonvenomous insect and other nonvenomous arthropods, initial encounter: Secondary | ICD-10-CM | POA: Diagnosis not present

## 2020-11-14 DIAGNOSIS — I10 Essential (primary) hypertension: Secondary | ICD-10-CM | POA: Diagnosis not present

## 2020-11-14 DIAGNOSIS — Z6825 Body mass index (BMI) 25.0-25.9, adult: Secondary | ICD-10-CM | POA: Diagnosis not present

## 2021-02-24 DIAGNOSIS — H524 Presbyopia: Secondary | ICD-10-CM | POA: Diagnosis not present

## 2021-02-26 ENCOUNTER — Other Ambulatory Visit: Payer: Self-pay | Admitting: Family Medicine

## 2021-02-26 NOTE — Telephone Encounter (Signed)
Pt is due for her CPE, please schedule hear at [place] now or if pt needs to wait till Dec/Jan to have it done at [facility] is fine also, pt just needs an appt on the books, one appt is scheduled please route back to me to refill med

## 2021-03-01 NOTE — Telephone Encounter (Signed)
Lvm for pt to call and schedule a cpe/lab

## 2021-03-02 NOTE — Telephone Encounter (Signed)
Lvm for pt to reach out to [place] to schedule a cpe/lab

## 2021-03-04 ENCOUNTER — Other Ambulatory Visit: Payer: Self-pay | Admitting: Family Medicine

## 2021-03-24 ENCOUNTER — Encounter: Payer: Self-pay | Admitting: Family Medicine

## 2021-03-24 ENCOUNTER — Other Ambulatory Visit: Payer: Self-pay

## 2021-03-24 ENCOUNTER — Ambulatory Visit (INDEPENDENT_AMBULATORY_CARE_PROVIDER_SITE_OTHER): Payer: Medicare HMO | Admitting: Family Medicine

## 2021-03-24 VITALS — BP 126/70 | HR 78 | Temp 97.6°F | Ht 67.0 in | Wt 166.0 lb

## 2021-03-24 DIAGNOSIS — Z0000 Encounter for general adult medical examination without abnormal findings: Secondary | ICD-10-CM | POA: Diagnosis not present

## 2021-03-24 DIAGNOSIS — M8580 Other specified disorders of bone density and structure, unspecified site: Secondary | ICD-10-CM

## 2021-03-24 DIAGNOSIS — E2839 Other primary ovarian failure: Secondary | ICD-10-CM | POA: Diagnosis not present

## 2021-03-24 DIAGNOSIS — I10 Essential (primary) hypertension: Secondary | ICD-10-CM

## 2021-03-24 DIAGNOSIS — Z23 Encounter for immunization: Secondary | ICD-10-CM

## 2021-03-24 DIAGNOSIS — R69 Illness, unspecified: Secondary | ICD-10-CM | POA: Diagnosis not present

## 2021-03-24 DIAGNOSIS — F411 Generalized anxiety disorder: Secondary | ICD-10-CM

## 2021-03-24 DIAGNOSIS — E7800 Pure hypercholesterolemia, unspecified: Secondary | ICD-10-CM | POA: Diagnosis not present

## 2021-03-24 LAB — CBC WITH DIFFERENTIAL/PLATELET
Basophils Absolute: 0 10*3/uL (ref 0.0–0.1)
Basophils Relative: 0.6 % (ref 0.0–3.0)
Eosinophils Absolute: 0.1 10*3/uL (ref 0.0–0.7)
Eosinophils Relative: 1.9 % (ref 0.0–5.0)
HCT: 41.3 % (ref 36.0–46.0)
Hemoglobin: 13.7 g/dL (ref 12.0–15.0)
Lymphocytes Relative: 24 % (ref 12.0–46.0)
Lymphs Abs: 1.5 10*3/uL (ref 0.7–4.0)
MCHC: 33 g/dL (ref 30.0–36.0)
MCV: 98.5 fl (ref 78.0–100.0)
Monocytes Absolute: 0.5 10*3/uL (ref 0.1–1.0)
Monocytes Relative: 8.2 % (ref 3.0–12.0)
Neutro Abs: 4.2 10*3/uL (ref 1.4–7.7)
Neutrophils Relative %: 65.3 % (ref 43.0–77.0)
Platelets: 245 10*3/uL (ref 150.0–400.0)
RBC: 4.2 Mil/uL (ref 3.87–5.11)
RDW: 13 % (ref 11.5–15.5)
WBC: 6.4 10*3/uL (ref 4.0–10.5)

## 2021-03-24 LAB — COMPREHENSIVE METABOLIC PANEL
ALT: 21 U/L (ref 0–35)
AST: 21 U/L (ref 0–37)
Albumin: 4.5 g/dL (ref 3.5–5.2)
Alkaline Phosphatase: 56 U/L (ref 39–117)
BUN: 22 mg/dL (ref 6–23)
CO2: 30 mEq/L (ref 19–32)
Calcium: 9.5 mg/dL (ref 8.4–10.5)
Chloride: 105 mEq/L (ref 96–112)
Creatinine, Ser: 1.01 mg/dL (ref 0.40–1.20)
GFR: 56.58 mL/min — ABNORMAL LOW (ref >60.00)
Glucose, Bld: 91 mg/dL (ref 70–99)
Potassium: 4.4 mEq/L (ref 3.5–5.1)
Sodium: 141 mEq/L (ref 135–145)
Total Bilirubin: 0.8 mg/dL (ref 0.2–1.2)
Total Protein: 7 g/dL (ref 6.0–8.3)

## 2021-03-24 LAB — LIPID PANEL
Cholesterol: 218 mg/dL — ABNORMAL HIGH (ref 0–200)
HDL: 85.8 mg/dL (ref >39.00)
LDL Cholesterol: 114 mg/dL — ABNORMAL HIGH (ref 0–99)
NonHDL: 131.89
Total CHOL/HDL Ratio: 3
Triglycerides: 91 mg/dL (ref 0.0–149.0)
VLDL: 18.2 mg/dL (ref 0.0–40.0)

## 2021-03-24 LAB — TSH: TSH: 3.4 u[IU]/mL (ref 0.35–5.50)

## 2021-03-24 MED ORDER — LISINOPRIL 10 MG PO TABS: 10.0000 mg | ORAL_TABLET | Freq: Every day | ORAL | 3 refills | Status: DC

## 2021-03-24 MED ORDER — PAROXETINE HCL 20 MG PO TABS: ORAL_TABLET | ORAL | 3 refills | Status: DC

## 2021-03-24 NOTE — Patient Instructions (Addendum)
Please make your appt for mammogram and bone density  [facility]  Get the shingrix vaccine when you can   Try to get 1200-1500 mg of calcium per day with at least 1000 iu of vitamin D - for bone health  Labs today   Please call the location of your choice from the menu below to schedule your Mammogram and/or Bone Density appointment.    [facility]                      Phone:  [phone], [place] [address]                                                             Services: Traditional and 3D Mammogram, [place] [facility]                 Phone: [phone] [address], [place] [address]    Service: Bone Density ONLY   *this site does NOT perform mammograms  [facility]                        Phone:  [phone] [address], [place] [address]                                            Services:  3D Mammogram and [place] at [facility]   Phone:  [phone]   [address], [place] [address]                                            Services: 3D Mammogram and [place]  [place] at [facility])  Phone:  [phone]   [address] Room [place], [place] [address]                                              Services:  3D Mammogram and Bone Density

## 2021-03-24 NOTE — Assessment & Plan Note (Signed)
bp in fair control at this time  BP Readings from Last 1 Encounters:  03/24/21 126/70   No changes needed Most recent labs reviewed  Disc lifstyle change with low sodium diet and exercise  Plan to continue lisinopril 10 mg daily

## 2021-03-24 NOTE — Assessment & Plan Note (Signed)
Reviewed health habits including diet and exercise and skin cancer prevention Reviewed appropriate screening tests for age  Also reviewed health mt list, fam hx and immunization status , as well as social and family history   See HPI Labs ordered  Colonoscopy utd Pt plans to schedule mammogram, due this month, given info  Flu vaccine given  Planning to get shingrix  dexa ordered-she will schedule No falls or fx- discussed imp of getting back on ca and D No falls or fractures Good exercise habits Adv directive is up to date No cognitive concerns Reviewed hearing screen  Eye and vision care is utd PHQ score is 0 No help needed for ADLs  Very good functional status

## 2021-03-24 NOTE — Assessment & Plan Note (Signed)
Due for dexa next month  Ordered  No falls/fx Enc strongly to start back on ca and D Good exercise

## 2021-03-24 NOTE — Assessment & Plan Note (Signed)
Stable/doing well with paxil 20 mg daily Wishes to continue this  Reviewed stressors/ coping techniques/symptoms/ support sources/ tx options and side effects in detail today  Encouraged good self care

## 2021-03-24 NOTE — Assessment & Plan Note (Signed)
Labs ordered Disc goals for lipids and reasons to control them Rev last labs with pt Rev low sat fat diet in detail Diet controlled

## 2021-03-24 NOTE — Progress Notes (Signed)
Subjective:    Patient ID: Caitlin Perez, female    DOB: 08-27-1950, 70 y.o.   MRN: [id number]  This visit occurred during the SARS-CoV-2 public health emergency.  Safety protocols were in place, including screening questions prior to the visit, additional usage of staff PPE, and extensive cleaning of exam room while observing appropriate contact time as indicated for disinfecting solutions.   HPI Pt presents for amw and health mt visit  I have personally reviewed the Medicare Annual Wellness questionnaire and have noted 1. The patient's medical and social history 2. Their use of alcohol, tobacco or illicit drugs 3. Their current medications and supplements 4. The patient's functional ability including ADL's, fall risks, home safety risks and hearing or visual             impairment. 5. Diet and physical activities 6. Evidence for depression or mood disorders  The patients weight, height, BMI have been recorded in the chart and visual acuity is per eye clinic.  I have made referrals, counseling and provided education to the patient based review of the above and I have provided the pt with a written personalized care plan for preventive services. Reviewed and updated provider list, see scanned forms.  See scanned forms.  Routine anticipatory guidance given to patient.  See health maintenance. Colon cancer screening  colonoscopy 11/2016 Breast cancer screening  mammogram 11/21 Self breast exam:  no lumps or changes  Flu vaccine: needs one today Tetanus vaccine 10/20 Tdap Pneumovax completed Zoster vaccine: planning to get shingrix  Covid immunized  Dexa  11/20  Osteopenia  Falls-one w/o injury  Fractures-none  Supplements- not on any  Exercise - does water fitness classes 4 h per week and daily walking   Advance directive : up to date  Cognitive function addressed- see scanned forms- and if abnormal then additional documentation follows.   No memory problems  Every once  in a while has to try harder to remember a name   No confusion  Does not get lost  Does her own finances  Reads a lot    PMH and SH reviewed  Meds, vitals, and allergies reviewed.   ROS: See HPI.  Otherwise negative.    Weight : Wt Readings from Last 3 Encounters:  03/24/21 166 lb (75.3 kg)  03/02/20 163 lb 11.2 oz (74.3 kg)  02/19/19 166 lb 6 oz (75.5 kg)   26.00 kg/m    Hearing/vision: Hearing Screening   500Hz  1000Hz  2000Hz  4000Hz   Right ear 20 20 20 20   Left ear 40 40 25 40  Vision Screening - Comments:: Patient sees eye doctor yearly.   She notes some hearing change, more in the L  Dislikes restaurants -if loud    PHQ: Depression screen [facility] 2/9 03/24/2021 03/02/2020 02/18/2019 01/31/2018 10/26/2016  Decreased Interest 0 0 0 0 0  Down, Depressed, Hopeless 0 0 0 0 0  PHQ - 2 Score 0 0 0 0 0  Altered sleeping - - 0 - -  Tired, decreased energy - - 0 - -  Change in appetite - - 0 - -  Feeling bad or failure about yourself  - - 0 - -  Trouble concentrating - - 0 - -  Moving slowly or fidgety/restless - - 0 - -  Suicidal thoughts - - 0 - -  PHQ-9 Score - - 0 - -  Difficult doing work/chores - - Not difficult at all - -     ADLs: no help  needed   Functionality:very good   Care team  [facility]- pcp  [facility]- behavioral health   HTN bp is stable today  No cp or palpitations or headaches or edema  No side effects to medicines  BP Readings from Last 3 Encounters:  03/24/21 126/70  03/02/20 134/78  02/19/19 138/80  Lisinopril 10 mg daily   Hyperlipidemia Lab Results  Component Value Date   CHOL 206 (H) 02/26/2020   HDL 80.40 02/26/2020   LDLCALC 113 (H) 02/26/2020   LDLDIRECT 119.6 10/26/2009   TRIG 63.0 02/26/2020   CHOLHDL 3 02/26/2020     Patient Active Problem List   Diagnosis Date Noted   Osteopenia 04/02/2019   Medicare annual wellness visit, subsequent 01/31/2018   Estrogen deficiency 01/31/2018   Essential hypertension 01/31/2018    Colon cancer screening 10/26/2016   Routine general medical examination at a health care facility 12/25/2012   Encounter for routine gynecological examination 12/25/2012   Hyperlipidemia 08/25/2009   GENERALIZED ANXIETY DISORDER 01/27/2009   Past Medical History:  Diagnosis Date   Anxiety    Other and unspecified hyperlipidemia    Past Surgical History:  Procedure Laterality Date   BREAST BIOPSY Right    COLONOSCOPY  2009   Social History   Tobacco Use   Smoking status: Former   Smokeless tobacco: Never   Tobacco comments:    over 30 years ago  Vaping Use   Vaping Use: Never used  Substance Use Topics   Alcohol use: Yes    Alcohol/week: 0.0 standard drinks    Comment: 7 drinks/week   Drug use: No   Family History  Problem Relation Age of Onset   Depression Sister    Breast cancer Sister    Cancer Mother    Ovarian cancer Maternal Aunt    Heart disease Other        GP   Stroke Other        GP   Brain cancer Other        Parent  +smoker   Anxiety disorder Sister    Allergies  Allergen Reactions   Codeine    No current outpatient medications on file prior to visit.   No current facility-administered medications on file prior to visit.     Review of Systems  Constitutional:  Negative for activity change, appetite change, fatigue, fever and unexpected weight change.  HENT:  Negative for congestion, ear pain, rhinorrhea, sinus pressure and sore throat.   Eyes:  Negative for pain, redness and visual disturbance.  Respiratory:  Negative for cough, shortness of breath and wheezing.   Cardiovascular:  Negative for chest pain and palpitations.  Gastrointestinal:  Negative for abdominal pain, blood in stool, constipation and diarrhea.  Endocrine: Negative for polydipsia and polyuria.  Genitourinary:  Negative for dysuria, frequency and urgency.       Occ R groin pain when swimming  Musculoskeletal:  Negative for arthralgias, back pain and myalgias.  Skin:   Negative for pallor and rash.  Allergic/Immunologic: Negative for environmental allergies.  Neurological:  Negative for dizziness, syncope and headaches.  Hematological:  Negative for adenopathy. Does not bruise/bleed easily.  Psychiatric/Behavioral:  Negative for decreased concentration and dysphoric mood. The patient is not nervous/anxious.       Objective:   Physical Exam Constitutional:      General: She is not in acute distress.    Appearance: Normal appearance. She is well-developed and normal weight. She is not ill-appearing or diaphoretic.  HENT:  Head: Normocephalic and atraumatic.     Right Ear: Tympanic membrane, ear canal and external ear normal.     Left Ear: Tympanic membrane, ear canal and external ear normal.     Nose: Nose normal. No congestion.     Mouth/Throat:     Mouth: Mucous membranes are moist.     Pharynx: Oropharynx is clear. No posterior oropharyngeal erythema.  Eyes:     General: No scleral icterus.    Extraocular Movements: Extraocular movements intact.     Conjunctiva/sclera: Conjunctivae normal.     Pupils: Pupils are equal, round, and reactive to light.  Neck:     Thyroid: No thyromegaly.     Vascular: No carotid bruit or JVD.  Cardiovascular:     Rate and Rhythm: Normal rate and regular rhythm.     Pulses: Normal pulses.     Heart sounds: Normal heart sounds.    No gallop.  Pulmonary:     Effort: Pulmonary effort is normal. No respiratory distress.     Breath sounds: Normal breath sounds. No wheezing.     Comments: Good air exch Chest:     Chest wall: No tenderness.  Abdominal:     General: Bowel sounds are normal. There is no distension or abdominal bruit.     Palpations: Abdomen is soft. There is no mass.     Tenderness: There is no abdominal tenderness.     Hernia: No hernia is present.  Genitourinary:    Comments: Breast exam: No mass, nodules, thickening, tenderness, bulging, retraction, inflamation, nipple discharge or skin  changes noted.  No axillary or clavicular LA.     Musculoskeletal:        General: No tenderness. Normal range of motion.     Cervical back: Normal range of motion and neck supple. No rigidity. No muscular tenderness.     Right lower leg: No edema.     Left lower leg: No edema.     Comments: No kyphosis  Great posture   Nl rom of both hips  Lymphadenopathy:     Cervical: No cervical adenopathy.  Skin:    General: Skin is warm and dry.     Coloration: Skin is not pale.     Findings: No erythema or rash.     Comments: Solar lentigines diffusely   Neurological:     Mental Status: She is alert. Mental status is at baseline.     Cranial Nerves: No cranial nerve deficit.     Motor: No abnormal muscle tone.     Coordination: Coordination normal.     Gait: Gait normal.     Deep Tendon Reflexes: Reflexes are normal and symmetric. Reflexes normal.  Psychiatric:        Mood and Affect: Mood normal.        Cognition and Memory: Cognition and memory normal.          Assessment & Plan:   Problem List Items Addressed This Visit       Cardiovascular and Mediastinum   Essential hypertension    bp in fair control at this time  BP Readings from Last 1 Encounters:  03/24/21 126/70  No changes needed Most recent labs reviewed  Disc lifstyle change with low sodium diet and exercise  Plan to continue lisinopril 10 mg daily       Relevant Medications   lisinopril (ZESTRIL) 10 MG tablet   Other Relevant Orders   CBC with Differential/Platelet   Comprehensive metabolic panel  Lipid panel   TSH     Musculoskeletal and Integument   Osteopenia    Due for dexa next month  Ordered  No falls/fx Enc strongly to start back on ca and D Good exercise         Other   Hyperlipidemia    Labs ordered Disc goals for lipids and reasons to control them Rev last labs with pt Rev low sat fat diet in detail Diet controlled       Relevant Medications   lisinopril (ZESTRIL) 10 MG  tablet   Other Relevant Orders   Lipid panel   GENERALIZED ANXIETY DISORDER    Stable/doing well with paxil 20 mg daily Wishes to continue this  Reviewed stressors/ coping techniques/symptoms/ support sources/ tx options and side effects in detail today  Encouraged good self care      Relevant Medications   PARoxetine (PAXIL) 20 MG tablet   Routine general medical examination at a health care facility    Reviewed health habits including diet and exercise and skin cancer prevention Reviewed appropriate screening tests for age  Also reviewed health mt list, fam hx and immunization status , as well as social and family history   See HPI Labs ordered  Colonoscopy utd Pt plans to schedule mammogram, due this month, given info  Flu vaccine given  Planning to get shingrix  dexa ordered-she will schedule No falls or fx- discussed imp of getting back on ca and D No falls or fractures Good exercise habits Adv directive is up to date No cognitive concerns Reviewed hearing screen  Eye and vision care is utd PHQ score is 0 No help needed for ADLs  Very good functional status       Medicare annual wellness visit, subsequent - Primary    Reviewed health habits including diet and exercise and skin cancer prevention Reviewed appropriate screening tests for age  Also reviewed health mt list, fam hx and immunization status , as well as social and family history   See HPI Labs ordered  Colonoscopy utd Pt plans to schedule mammogram, due this month, given info  Flu vaccine given  Planning to get shingrix  dexa ordered-she will schedule No falls or fx- discussed imp of getting back on ca and D No falls or fractures Good exercise habits Adv directive is up to date No cognitive concerns Reviewed hearing screen  Eye and vision care is utd PHQ score is 0 No help needed for ADLs  Very good functional status      Estrogen deficiency   Relevant Orders   DG Bone Density

## 2021-03-25 ENCOUNTER — Encounter: Payer: Self-pay | Admitting: *Deleted

## 2021-04-06 ENCOUNTER — Other Ambulatory Visit: Payer: Self-pay | Admitting: Family Medicine

## 2021-04-06 DIAGNOSIS — Z1231 Encounter for screening mammogram for malignant neoplasm of breast: Secondary | ICD-10-CM

## 2021-09-08 ENCOUNTER — Ambulatory Visit
Admission: RE | Admit: 2021-09-08 | Discharge: 2021-09-08 | Disposition: A | Discharge status: Home / Self Care | Payer: Medicare HMO | Source: Ambulatory Visit | Attending: Family Medicine | Admitting: Family Medicine

## 2021-09-08 DIAGNOSIS — M8589 Other specified disorders of bone density and structure, multiple sites: Secondary | ICD-10-CM | POA: Diagnosis not present

## 2021-09-08 DIAGNOSIS — Z1231 Encounter for screening mammogram for malignant neoplasm of breast: Secondary | ICD-10-CM | POA: Diagnosis not present

## 2021-09-08 DIAGNOSIS — E2839 Other primary ovarian failure: Secondary | ICD-10-CM

## 2021-09-08 DIAGNOSIS — Z780 Asymptomatic menopausal state: Secondary | ICD-10-CM | POA: Diagnosis not present

## 2021-09-29 ENCOUNTER — Ambulatory Visit: Payer: Medicare HMO | Admitting: Dermatology

## 2021-09-29 ENCOUNTER — Encounter: Payer: Self-pay | Admitting: Dermatology

## 2021-09-29 DIAGNOSIS — D1801 Hemangioma of skin and subcutaneous tissue: Secondary | ICD-10-CM

## 2021-09-29 DIAGNOSIS — I781 Nevus, non-neoplastic: Secondary | ICD-10-CM

## 2021-09-29 DIAGNOSIS — L570 Actinic keratosis: Secondary | ICD-10-CM

## 2021-09-29 DIAGNOSIS — L821 Other seborrheic keratosis: Secondary | ICD-10-CM | POA: Diagnosis not present

## 2021-09-29 DIAGNOSIS — Z1283 Encounter for screening for malignant neoplasm of skin: Secondary | ICD-10-CM | POA: Diagnosis not present

## 2021-09-29 DIAGNOSIS — Q828 Other specified congenital malformations of skin: Secondary | ICD-10-CM

## 2021-10-06 ENCOUNTER — Ambulatory Visit: Payer: Medicare HMO | Admitting: Dermatology

## 2021-10-22 ENCOUNTER — Encounter: Payer: Self-pay | Admitting: Dermatology

## 2021-10-22 NOTE — Progress Notes (Signed)
   New Patient   Subjective  Brena Windsor is a 71 y.o. female who presents for the following: Annual Exam (New patient full body skin check with no personal history of skin cancer, sister has history of skin cancer ).  Annual skin examination Location:  Duration:  Quality:  Associated Signs/Symptoms: Modifying Factors:  Severity:  Timing: Context:    The following portions of the chart were reviewed this encounter and updated as appropriate:  Tobacco  Allergies  Meds  Problems  Med Hx  Surg Hx  Fam Hx      Objective  Well appearing patient in no apparent distress; mood and affect are within normal limits. Full body skin exam.  Typical pigmented lesions (all checked with dermoscopy), no nonmelanoma skin cancer  Head - Anterior (Face) Telangiectasia on dermoscopy without atypical vessels and no palpability.  Right Forearm - Anterior Solitary 1 cm pink scale with sharp edge, historically stable  Left Forearm - Anterior Hornlike 3 mm pink crust  Right Thigh - Anterior Light brown textured flattopped 4 mm papule, compatible dermoscopy  Right Abdomen (side) - Upper Several 1 mm smooth red dermal papules    A full examination was performed including scalp, head, eyes, ears, nose, lips, neck, chest, axillae, abdomen, back, buttocks, bilateral upper extremities, bilateral lower extremities, hands, feet, fingers, toes, fingernails, and toenails. All findings within normal limits unless otherwise noted below.  Areas beneath undergarments not fully examined   Assessment & Plan  Telangiectasia Head - Anterior (Face)  Return if there is bleeding or a feelable bump appears.  Screening for malignant neoplasm of skin  Yearly skin exam  Porokeratosis Right Forearm - Anterior  Return if there is clinical change  AK (actinic keratosis) Left Forearm - Anterior  Destruction of lesion - Left Forearm - Anterior  Destruction method: cryotherapy   Informed consent:  discussed and consent obtained   Timeout:  patient name, date of birth, surgical site, and procedure verified Lesion destroyed using liquid nitrogen: Yes   Region frozen until ice ball extended beyond lesion: Yes   Cryotherapy cycles:  5 Outcome: patient tolerated procedure well with no complications    Seborrheic keratosis Right Thigh - Anterior  Leave if stable  Hemangioma of skin Right Abdomen (side) - Upper  No intervention necessary

## 2021-12-14 DIAGNOSIS — Z0100 Encounter for examination of eyes and vision without abnormal findings: Secondary | ICD-10-CM | POA: Diagnosis not present

## 2022-03-10 ENCOUNTER — Other Ambulatory Visit: Payer: Self-pay | Admitting: Family Medicine

## 2022-03-10 NOTE — Telephone Encounter (Signed)
Pt is due for her CPE after 03/24/22 please schedule and then route back to me to refill

## 2022-03-10 NOTE — Telephone Encounter (Signed)
LVM for patient to call and schedule

## 2022-04-05 ENCOUNTER — Telehealth: Payer: Self-pay | Admitting: Family Medicine

## 2022-04-05 DIAGNOSIS — E7800 Pure hypercholesterolemia, unspecified: Secondary | ICD-10-CM

## 2022-04-05 DIAGNOSIS — I10 Essential (primary) hypertension: Secondary | ICD-10-CM

## 2022-04-05 NOTE — Telephone Encounter (Signed)
-----   Message from Velna Hatchet, RT sent at 03/28/2022  2:39 PM EST ----- Regarding: Wed 11/22 labs Patient is scheduled for cpx, please order future labs.  Thanks, Anda Kraft

## 2022-04-06 ENCOUNTER — Other Ambulatory Visit (INDEPENDENT_AMBULATORY_CARE_PROVIDER_SITE_OTHER): Payer: Medicare HMO

## 2022-04-06 DIAGNOSIS — E7800 Pure hypercholesterolemia, unspecified: Secondary | ICD-10-CM

## 2022-04-06 DIAGNOSIS — I10 Essential (primary) hypertension: Secondary | ICD-10-CM | POA: Diagnosis not present

## 2022-04-06 LAB — CBC WITH DIFFERENTIAL/PLATELET
Basophils Absolute: 0 10*3/uL (ref 0.0–0.1)
Basophils Relative: 1 % (ref 0.0–3.0)
Eosinophils Absolute: 0.1 10*3/uL (ref 0.0–0.7)
Eosinophils Relative: 2.1 % (ref 0.0–5.0)
HCT: 39.7 % (ref 36.0–46.0)
Hemoglobin: 13.5 g/dL (ref 12.0–15.0)
Lymphocytes Relative: 28.9 % (ref 12.0–46.0)
Lymphs Abs: 1.1 10*3/uL (ref 0.7–4.0)
MCHC: 34 g/dL (ref 30.0–36.0)
MCV: 99 fl (ref 78.0–100.0)
Monocytes Absolute: 0.3 10*3/uL (ref 0.1–1.0)
Monocytes Relative: 8 % (ref 3.0–12.0)
Neutro Abs: 2.3 10*3/uL (ref 1.4–7.7)
Neutrophils Relative %: 60 % (ref 43.0–77.0)
Platelets: 249 10*3/uL (ref 150.0–400.0)
RBC: 4.01 Mil/uL (ref 3.87–5.11)
RDW: 13 % (ref 11.5–15.5)
WBC: 3.8 10*3/uL — ABNORMAL LOW (ref 4.0–10.5)

## 2022-04-06 LAB — LIPID PANEL
Cholesterol: 210 mg/dL — ABNORMAL HIGH (ref 0–200)
HDL: 87 mg/dL (ref >39.00)
LDL Cholesterol: 107 mg/dL — ABNORMAL HIGH (ref 0–99)
NonHDL: 123.2
Total CHOL/HDL Ratio: 2
Triglycerides: 80 mg/dL (ref 0.0–149.0)
VLDL: 16 mg/dL (ref 0.0–40.0)

## 2022-04-06 LAB — COMPREHENSIVE METABOLIC PANEL
ALT: 20 U/L (ref 0–35)
AST: 17 U/L (ref 0–37)
Albumin: 4.2 g/dL (ref 3.5–5.2)
Alkaline Phosphatase: 53 U/L (ref 39–117)
BUN: 19 mg/dL (ref 6–23)
CO2: 30 mEq/L (ref 19–32)
Calcium: 9.1 mg/dL (ref 8.4–10.5)
Chloride: 102 mEq/L (ref 96–112)
Creatinine, Ser: 1.05 mg/dL (ref 0.40–1.20)
GFR: 53.61 mL/min — ABNORMAL LOW (ref >60.00)
Glucose, Bld: 100 mg/dL — ABNORMAL HIGH (ref 70–99)
Potassium: 4.6 mEq/L (ref 3.5–5.1)
Sodium: 138 mEq/L (ref 135–145)
Total Bilirubin: 0.7 mg/dL (ref 0.2–1.2)
Total Protein: 6.4 g/dL (ref 6.0–8.3)

## 2022-04-06 LAB — TSH: TSH: 3.69 u[IU]/mL (ref 0.35–5.50)

## 2022-04-15 ENCOUNTER — Ambulatory Visit (INDEPENDENT_AMBULATORY_CARE_PROVIDER_SITE_OTHER): Payer: Medicare HMO | Admitting: Family Medicine

## 2022-04-15 ENCOUNTER — Encounter: Payer: Self-pay | Admitting: Family Medicine

## 2022-04-15 VITALS — BP 131/82 | HR 62 | Temp 97.1°F | Ht 67.25 in | Wt 167.1 lb

## 2022-04-15 DIAGNOSIS — M8580 Other specified disorders of bone density and structure, unspecified site: Secondary | ICD-10-CM | POA: Diagnosis not present

## 2022-04-15 DIAGNOSIS — R7309 Other abnormal glucose: Secondary | ICD-10-CM | POA: Diagnosis not present

## 2022-04-15 DIAGNOSIS — Z0000 Encounter for general adult medical examination without abnormal findings: Secondary | ICD-10-CM

## 2022-04-15 DIAGNOSIS — F411 Generalized anxiety disorder: Secondary | ICD-10-CM

## 2022-04-15 DIAGNOSIS — E7800 Pure hypercholesterolemia, unspecified: Secondary | ICD-10-CM

## 2022-04-15 DIAGNOSIS — R69 Illness, unspecified: Secondary | ICD-10-CM | POA: Diagnosis not present

## 2022-04-15 DIAGNOSIS — I10 Essential (primary) hypertension: Secondary | ICD-10-CM | POA: Diagnosis not present

## 2022-04-15 MED ORDER — LISINOPRIL 10 MG PO TABS: 10.0000 mg | ORAL_TABLET | Freq: Every day | ORAL | 3 refills | Status: DC

## 2022-04-15 MED ORDER — PAROXETINE HCL 20 MG PO TABS: ORAL_TABLET | ORAL | 3 refills | Status: DC

## 2022-04-15 NOTE — Assessment & Plan Note (Signed)
Fasting glucose was 100 Disc sugar intake disc imp of low glycemic diet and wt loss to prevent DM2   Will check A1c at next annual exam

## 2022-04-15 NOTE — Assessment & Plan Note (Signed)
bp in fair control at this time  BP Readings from Last 1 Encounters:  04/15/22 131/82   No changes needed Most recent labs reviewed  Disc lifstyle change with low sodium diet and exercise  Plan to continue lisinopril 10 mg daily

## 2022-04-15 NOTE — Patient Instructions (Addendum)
Think about adding some strength training exercise  Some light weights or exercise bands  Keep up the great exercise   Cut wine/alcohol to one drink per day   Try and cut sugar as well Try to get most of your carbohydrates from produce (with the exception of white potatoes)  Eat less bread/pasta/rice/snack foods/cereals/sweets and other items from the middle of the grocery store (processed carbs)

## 2022-04-15 NOTE — Assessment & Plan Note (Signed)
Reviewed health habits including diet and exercise and skin cancer prevention Reviewed appropriate screening tests for age  Also reviewed health mt list, fam hx and immunization status , as well as social and family history   See HPI Labs reviewed  Colonoscopy utd 2018  Mammo utd 08/2021  Good exercise, enc to add more strength training  Dexa utd no falls or fx and taking ca and D -will inc to bid  Mood is stable Enc sun protection

## 2022-04-15 NOTE — Assessment & Plan Note (Signed)
Disc goals for lipids and reasons to control them Rev last labs with pt Rev low sat fat diet in detail  Improved today with LDL 107 Enc to keep up the good work

## 2022-04-15 NOTE — Progress Notes (Signed)
Subjective:    Patient ID: Caitlin Perez, female    DOB: 01/04/1951, 71 y.o.   MRN: [id number]  HPI Here for health maintenance exam and to review chronic medical problems    Wt Readings from Last 3 Encounters:  04/15/22 167 lb 2 oz (75.8 kg)  03/24/21 166 lb (75.3 kg)  03/02/20 163 lb 11.2 oz (74.3 kg)   25.98 kg/m  Doing well  Missed her amw call-it was late  Wants to re schedule    Immunization History  Administered Date(s) Administered   Fluad Quad(high Dose 65+) 01/31/2019, 01/31/2019, 03/02/2020, 03/24/2021, 01/18/2022   Influenza, High Dose Seasonal PF 02/17/2017, 01/25/2018, 01/31/2019   Influenza-Unspecified 02/08/2015   PFIZER Comirnaty(Gray Top)Covid-19 Tri-Sucrose Vaccine 11/04/2020   PFIZER(Purple Top)SARS-COV-2 Vaccination 06/27/2019, 07/22/2019, 04/07/2020   Pfizer Covid-19 Vaccine Bivalent Booster 53yr & up 01/18/2022   Pneumococcal Conjugate-13 10/26/2016   Pneumococcal Polysaccharide-23 01/31/2018   Td 05/16/2005   Tdap 02/21/2019   Zoster Recombinat (Shingrix) 06/29/2021, 09/16/2021   Health Maintenance Due  Topic Date Due   Hepatitis C Screening  Never done   COVID-19 Vaccine (6 - 2023-24 season) 03/15/2022   Medicare Annual Wellness (AWV)  03/24/2022   Mammogram 08/2021 Self breast exam: no lumps   Colonoscopy 11/2016 - 10 y  No fam h/o colon cancer   Dexa  08/2021  stable osteopenia  Falls : none  Fractures:none  Supplements  ca and D Exercise : walking and water aerobics and gardening   HTN bp is stable today  No cp or palpitations or headaches or edema  No side effects to medicines  BP Readings from Last 3 Encounters:  04/15/22 131/82  03/24/21 126/70  03/02/20 134/78    Lisinopril 10 mg daily     Mood is good  PHQ score aff by sleep and concentration issues chronically Needs a lot more sleep than average - 10 h per day  Trouble concentrating with ADD  Takes paxil for GAD Paxil 20 mg daily   Hyperlipidemia Lab  Results  Component Value Date   CHOL 210 (H) 04/06/2022   CHOL 218 (H) 03/24/2021   CHOL 206 (H) 02/26/2020   Lab Results  Component Value Date   HDL 87.00 04/06/2022   HDL 85.80 03/24/2021   HDL 80.40 02/26/2020   Lab Results  Component Value Date   LDLCALC 107 (H) 04/06/2022   LDLCALC 114 (H) 03/24/2021   LDLCALC 113 (H) 02/26/2020   Lab Results  Component Value Date   TRIG 80.0 04/06/2022   TRIG 91.0 03/24/2021   TRIG 63.0 02/26/2020   Lab Results  Component Value Date   CHOLHDL 2 04/06/2022   CHOLHDL 3 03/24/2021   CHOLHDL 3 02/26/2020   Lab Results  Component Value Date   LDLDIRECT 119.6 10/26/2009   LDLDIRECT 143.2 01/27/2009   Diet controlled  LDL is down to 107  Diet is good    Glucose 100  Tries to avoid sweets  Does drink wine- 2 glasses per day   Lab Results  Component Value Date   WBC 3.8 (L) 04/06/2022   HGB 13.5 04/06/2022   HCT 39.7 04/06/2022   MCV 99.0 04/06/2022   PLT 249.0 04/06/2022   Lab Results  Component Value Date   CREATININE 1.05 04/06/2022   BUN 19 04/06/2022   NA 138 04/06/2022   K 4.6 04/06/2022   CL 102 04/06/2022   CO2 30 04/06/2022   Lab Results  Component Value Date   ALT 20 04/06/2022  AST 17 04/06/2022   ALKPHOS 53 04/06/2022   BILITOT 0.7 04/06/2022   Lab Results  Component Value Date   TSH 3.69 04/06/2022    Patient Active Problem List   Diagnosis Date Noted   Elevated glucose 04/15/2022   Osteopenia 04/02/2019   Medicare annual wellness visit, subsequent 01/31/2018   Estrogen deficiency 01/31/2018   Essential hypertension 01/31/2018   Colon cancer screening 10/26/2016   Routine general medical examination at a health care facility 12/25/2012   Encounter for routine gynecological examination 12/25/2012   Hyperlipidemia 08/25/2009   Generalized anxiety disorder 01/27/2009   Past Medical History:  Diagnosis Date   Anxiety    Other and unspecified hyperlipidemia    Past Surgical History:   Procedure Laterality Date   BREAST BIOPSY Right    COLONOSCOPY  2009   Social History   Tobacco Use   Smoking status: Former   Smokeless tobacco: Never   Tobacco comments:    over 30 years ago  Vaping Use   Vaping Use: Never used  Substance Use Topics   Alcohol use: Yes    Alcohol/week: 0.0 standard drinks of alcohol    Comment: 7 drinks/week   Drug use: No   Family History  Problem Relation Age of Onset   Cancer Mother    Depression Sister    Breast cancer Sister 62   Anxiety disorder Sister    Ovarian cancer Maternal Aunt    Heart disease Other        GP   Stroke Other        GP   Brain cancer Other        Parent  +smoker   Allergies  Allergen Reactions   Codeine    Current Outpatient Medications on File Prior to Visit  Medication Sig Dispense Refill   VITAMIN D, CHOLECALCIFEROL, PO Take 1 capsule by mouth daily.     No current facility-administered medications on file prior to visit.     Review of Systems  Constitutional:  Negative for activity change, appetite change, fatigue, fever and unexpected weight change.  HENT:  Negative for congestion, ear pain, rhinorrhea, sinus pressure and sore throat.   Eyes:  Negative for pain, redness and visual disturbance.  Respiratory:  Negative for cough, shortness of breath and wheezing.   Cardiovascular:  Negative for chest pain and palpitations.  Gastrointestinal:  Negative for abdominal pain, blood in stool, constipation and diarrhea.  Endocrine: Negative for polydipsia and polyuria.  Genitourinary:  Negative for dysuria, frequency and urgency.  Musculoskeletal:  Negative for arthralgias, back pain and myalgias.  Skin:  Negative for pallor and rash.  Allergic/Immunologic: Negative for environmental allergies.  Neurological:  Negative for dizziness, syncope and headaches.  Hematological:  Negative for adenopathy. Does not bruise/bleed easily.  Psychiatric/Behavioral:  Positive for decreased concentration. Negative  for dysphoric mood. The patient is not nervous/anxious.        Objective:   Physical Exam Constitutional:      General: She is not in acute distress.    Appearance: Normal appearance. She is well-developed and normal weight. She is not ill-appearing or diaphoretic.  HENT:     Head: Normocephalic and atraumatic.     Right Ear: Tympanic membrane, ear canal and external ear normal.     Left Ear: Tympanic membrane, ear canal and external ear normal.     Nose: Nose normal. No congestion.     Mouth/Throat:     Mouth: Mucous membranes are moist.  Pharynx: Oropharynx is clear. No posterior oropharyngeal erythema.  Eyes:     General: No scleral icterus.    Extraocular Movements: Extraocular movements intact.     Conjunctiva/sclera: Conjunctivae normal.     Pupils: Pupils are equal, round, and reactive to light.  Neck:     Thyroid: No thyromegaly.     Vascular: No carotid bruit or JVD.  Cardiovascular:     Rate and Rhythm: Normal rate and regular rhythm.     Pulses: Normal pulses.     Heart sounds: Normal heart sounds.     No gallop.  Pulmonary:     Effort: Pulmonary effort is normal. No respiratory distress.     Breath sounds: Normal breath sounds. No wheezing.     Comments: Good air exch Chest:     Chest wall: No tenderness.  Abdominal:     General: Bowel sounds are normal. There is no distension or abdominal bruit.     Palpations: Abdomen is soft. There is no mass.     Tenderness: There is no abdominal tenderness.     Hernia: No hernia is present.  Genitourinary:    Comments: Breast exam: No mass, nodules, thickening, tenderness, bulging, retraction, inflamation, nipple discharge or skin changes noted.  No axillary or clavicular LA.     Musculoskeletal:        General: No tenderness. Normal range of motion.     Cervical back: Normal range of motion and neck supple. No rigidity. No muscular tenderness.     Right lower leg: No edema.     Left lower leg: No edema.      Comments: No kyphosis   Good posture   Lymphadenopathy:     Cervical: No cervical adenopathy.  Skin:    General: Skin is warm and dry.     Coloration: Skin is not pale.     Findings: No erythema or rash.     Comments: Solar lentigines diffusely   Neurological:     Mental Status: She is alert. Mental status is at baseline.     Cranial Nerves: No cranial nerve deficit.     Motor: No abnormal muscle tone.     Coordination: Coordination normal.     Gait: Gait normal.     Deep Tendon Reflexes: Reflexes are normal and symmetric.  Psychiatric:        Mood and Affect: Mood normal.        Cognition and Memory: Cognition and memory normal.           Assessment & Plan:   Problem List Items Addressed This Visit       Cardiovascular and Mediastinum   Essential hypertension    bp in fair control at this time  BP Readings from Last 1 Encounters:  04/15/22 131/82  No changes needed Most recent labs reviewed  Disc lifstyle change with low sodium diet and exercise  Plan to continue lisinopril 10 mg daily       Relevant Medications   lisinopril (ZESTRIL) 10 MG tablet     Musculoskeletal and Integument   Osteopenia     Other   Elevated glucose    Fasting glucose was 100 Disc sugar intake disc imp of low glycemic diet and wt loss to prevent DM2   Will check A1c at next annual exam      Generalized anxiety disorder    Continues to be stable with paxil 20 mg daily  She wants to continue this   Reviewed stressors/  coping techniques/symptoms/ support sources/ tx options and side effects in detail today       Relevant Medications   PARoxetine (PAXIL) 20 MG tablet   Hyperlipidemia    Disc goals for lipids and reasons to control them Rev last labs with pt Rev low sat fat diet in detail  Improved today with LDL 107 Enc to keep up the good work      Relevant Medications   lisinopril (ZESTRIL) 10 MG tablet   Routine general medical examination at a health care facility  - Primary    Reviewed health habits including diet and exercise and skin cancer prevention Reviewed appropriate screening tests for age  Also reviewed health mt list, fam hx and immunization status , as well as social and family history   See HPI Labs reviewed  Colonoscopy utd 2018  Mammo utd 08/2021  Good exercise, enc to add more strength training  Dexa utd no falls or fx and taking ca and D -will inc to bid  Mood is stable Enc sun protection

## 2022-04-15 NOTE — Assessment & Plan Note (Signed)
Continues to be stable with paxil 20 mg daily  She wants to continue this   Reviewed stressors/ coping techniques/symptoms/ support sources/ tx options and side effects in detail today

## 2022-08-05 ENCOUNTER — Other Ambulatory Visit: Payer: Self-pay | Admitting: Family Medicine

## 2022-08-05 DIAGNOSIS — Z0000 Encounter for general adult medical examination without abnormal findings: Secondary | ICD-10-CM

## 2022-09-20 ENCOUNTER — Ambulatory Visit
Admission: RE | Admit: 2022-09-20 | Discharge: 2022-09-20 | Disposition: A | Discharge status: Home / Self Care | Payer: Medicare HMO | Source: Ambulatory Visit | Attending: Family Medicine | Admitting: Family Medicine

## 2022-09-20 DIAGNOSIS — Z1231 Encounter for screening mammogram for malignant neoplasm of breast: Secondary | ICD-10-CM | POA: Diagnosis not present

## 2022-09-20 DIAGNOSIS — Z0000 Encounter for general adult medical examination without abnormal findings: Secondary | ICD-10-CM

## 2022-10-03 ENCOUNTER — Ambulatory Visit: Payer: Medicare HMO | Admitting: Dermatology

## 2023-02-11 ENCOUNTER — Other Ambulatory Visit: Payer: Self-pay | Admitting: Family Medicine

## 2023-05-01 ENCOUNTER — Ambulatory Visit: Payer: Medicare HMO | Admitting: Family Medicine

## 2023-07-23 ENCOUNTER — Other Ambulatory Visit: Payer: Self-pay | Admitting: Family Medicine

## 2023-07-24 NOTE — Telephone Encounter (Signed)
 Last CPE was 04/15/22, please call and schedule an CPE (labs prior if possible) and then route it back to me

## 2023-07-25 NOTE — Telephone Encounter (Signed)
Lvmtcb, no mychart set up to send message  

## 2023-07-26 NOTE — Telephone Encounter (Signed)
 lvm for pt to call office to schedule appt.

## 2023-07-26 NOTE — Telephone Encounter (Signed)
 Patient scheduled.

## 2023-08-07 DIAGNOSIS — H524 Presbyopia: Secondary | ICD-10-CM | POA: Diagnosis not present

## 2023-08-09 ENCOUNTER — Ambulatory Visit (INDEPENDENT_AMBULATORY_CARE_PROVIDER_SITE_OTHER): Admitting: Family Medicine

## 2023-08-09 ENCOUNTER — Encounter: Payer: Self-pay | Admitting: Family Medicine

## 2023-08-09 VITALS — BP 146/74 | HR 72 | Temp 98.6°F | Ht 67.0 in | Wt 161.1 lb

## 2023-08-09 DIAGNOSIS — I10 Essential (primary) hypertension: Secondary | ICD-10-CM | POA: Diagnosis not present

## 2023-08-09 DIAGNOSIS — R2689 Other abnormalities of gait and mobility: Secondary | ICD-10-CM

## 2023-08-09 DIAGNOSIS — E7800 Pure hypercholesterolemia, unspecified: Secondary | ICD-10-CM

## 2023-08-09 DIAGNOSIS — E2839 Other primary ovarian failure: Secondary | ICD-10-CM | POA: Diagnosis not present

## 2023-08-09 DIAGNOSIS — F411 Generalized anxiety disorder: Secondary | ICD-10-CM | POA: Diagnosis not present

## 2023-08-09 DIAGNOSIS — Z1283 Encounter for screening for malignant neoplasm of skin: Secondary | ICD-10-CM

## 2023-08-09 DIAGNOSIS — Z1211 Encounter for screening for malignant neoplasm of colon: Secondary | ICD-10-CM | POA: Diagnosis not present

## 2023-08-09 DIAGNOSIS — Z1231 Encounter for screening mammogram for malignant neoplasm of breast: Secondary | ICD-10-CM | POA: Diagnosis not present

## 2023-08-09 DIAGNOSIS — M8580 Other specified disorders of bone density and structure, unspecified site: Secondary | ICD-10-CM

## 2023-08-09 DIAGNOSIS — Z0000 Encounter for general adult medical examination without abnormal findings: Secondary | ICD-10-CM

## 2023-08-09 DIAGNOSIS — R7309 Other abnormal glucose: Secondary | ICD-10-CM

## 2023-08-09 NOTE — Patient Instructions (Addendum)
 Keep up exercise and strength training   I put the referral in for PT for balance and dermatology for skin cancer screening  Please let us know if you don't hear in 1-2 weeks    Go up on lisinopril to 20 mg daily   Follow up in about a month    You have an order for:  []   2D Mammogram  [x]   3D Mammogram  [x]   Bone Density     Please call for appointment:   []   [facility]  [address] [place] [address]  [phone]  []   [facility]   [address]  [place], [place] [address]  [phone]  [x]   [facility]      [address] [place], [place]        [phone]         []   [facility]  [address] [place], [place]  [phone]  []  [facility]   [address]   [place], [place] [address]  [phone]  []  [facility]  [address] [place], [place] [address] [phone]    Make sure to wear two piece clothing  No lotions powders or deodorants the day of the appointment Make sure to bring picture ID and insurance card.  Bring list of medications you are currently taking including any supplements.   Schedule your screening mammogram through MyChart!   Select [place] imaging sites can now be scheduled through MyChart.  Log into your MyChart account.  Go to 'Visit' (or 'Appointments' if  on mobile App) --> Schedule an  Appointment  Under 'Select a Reason for Visit' choose the Mammogram  Screening option.  Complete the pre-visit questions  and select the time and place that  best fits your schedule

## 2023-08-09 NOTE — Assessment & Plan Note (Signed)
 Ref to derm (hers retired)  Has history of AKs  Encouraged sun protection use

## 2023-08-09 NOTE — Assessment & Plan Note (Signed)
 A1c ordered  disc imp of low glycemic diet and wt loss to prevent DM2

## 2023-08-09 NOTE — Assessment & Plan Note (Signed)
 No falls Worried about falling Referred to PT   Encouraged to keep up her water exercise that includes strength training

## 2023-08-09 NOTE — Assessment & Plan Note (Signed)
 Dexa due next month-ordered  Discussed fall prevention, supplements and exercise for bone density   Great exercise habits No falls or fracture  Does c/o poor balance- ref to PT for this

## 2023-08-09 NOTE — Assessment & Plan Note (Signed)
 Dexa ordered

## 2023-08-09 NOTE — Assessment & Plan Note (Signed)
 Reviewed health habits including diet and exercise and skin cancer prevention Reviewed appropriate screening tests for age  Also reviewed health mt list, fam hx and immunization status , as well as social and family history   See HPI Labs reviewed and ordered Health Maintenance  Topic Date Due   COVID-19 Vaccine (6 - Pfizer risk 2024-25 season) 08/24/2025*   Hepatitis C Screening  08/08/2033*   Mammogram  09/20/2023   Medicare Annual Wellness Visit  08/08/2024   Colon Cancer Screening  11/26/2026   DTaP/Tdap/Td vaccine (3 - Td or Tdap) 02/20/2029   Pneumonia Vaccine  Completed   Flu Shot  Completed   DEXA scan (bone density measurement)  Completed   Zoster (Shingles) Vaccine  Completed   HPV Vaccine  Aged Out  *Topic was postponed. The date shown is not the original due date.   Dexa and mammo ordered  Ref to derm for skin cancer screening  Ref to PT for balance  PHQ 1 Labs ordered

## 2023-08-09 NOTE — Assessment & Plan Note (Signed)
 Disc goals for lipids and reasons to control them Rev last labs with pt Rev low sat fat diet in detail  Labs today  Diet controlled More processed meals lately / does use air fryer

## 2023-08-09 NOTE — Assessment & Plan Note (Signed)
Mammogram ordered °Pt will call to schedule  °

## 2023-08-09 NOTE — Assessment & Plan Note (Signed)
 Normal colonoscopy 11/2016 with10 y recall

## 2023-08-09 NOTE — Progress Notes (Signed)
 Subjective:    Patient ID: Caitlin Perez, female    DOB: 03/24/1951, 73 y.o.   MRN: [id number]  HPI  Here for health maintenance exam and to review chronic medical problems   Wt Readings from Last 3 Encounters:  08/09/23 161 lb 2 oz (73.1 kg)  04/15/22 167 lb 2 oz (75.8 kg)  03/24/21 166 lb (75.3 kg)   25.24 kg/m  Vitals:   08/09/23 1519  BP: (!) 146/74  Pulse: 72  Temp: 98.6 F (37 C)  SpO2: 97%    Immunization History  Administered Date(s) Administered   Fluad Quad(high Dose 65+) 01/31/2019, 01/31/2019, 03/02/2020, 03/24/2021, 01/18/2022   Influenza, High Dose Seasonal PF 02/17/2017, 01/25/2018, 01/31/2019, 01/30/2023   Influenza-Unspecified 02/08/2015   Moderna Covid-19 Fall Seasonal Vaccine 47yrs & older 01/30/2023   PFIZER Comirnaty(Gray Top)Covid-19 Tri-Sucrose Vaccine 11/04/2020   PFIZER(Purple Top)SARS-COV-2 Vaccination 06/27/2019, 07/22/2019, 04/07/2020   Pneumococcal Conjugate-13 10/26/2016   Pneumococcal Polysaccharide-23 01/31/2018   Td 05/16/2005   Tdap 02/21/2019   Zoster Recombinant(Shingrix) 06/29/2021, 09/16/2021    There are no preventive care reminders to display for this patient.  Hep c screening - very low risk   Husband had heart surgery  He is doing better    Mammogram 09/2022  Self breast exam- no lumps or changes   Gyn health-no problems    Colon cancer screening  -colonoscopy 11/2016 with 10 y recall   Bone health  Dexa  08/2021  osteopenia /stable  Falls- none  Fractures-none  Supplements -vitamin D   Balance is not as good as it was in the past Is careful  Esp on uneven surfaces   Exercise  Water fitness every day  Some yoga and balance work with that    Derm care - used to see Dr Hortense Ramal  Has AKs treated  Needs to get est with another office  Fairly good about sunscren  Is outdoors a lot     Mood    08/09/2023    3:23 PM 04/15/2022   10:04 AM 03/24/2021   11:47 AM 03/02/2020    3:56 PM 02/18/2019    10:40 AM  Depression screen PHQ 2/9  Decreased Interest 0 0 0 0 0  Down, Depressed, Hopeless 0 0 0 0 0  PHQ - 2 Score 0 0 0 0 0  Altered sleeping 0 1   0  Tired, decreased energy 1 1   0  Change in appetite 0 0   0  Feeling bad or failure about yourself  0 0   0  Trouble concentrating 0 1   0  Moving slowly or fidgety/restless 0 0   0  Suicidal thoughts 0 0   0  PHQ-9 Score 1 3   0  Difficult doing work/chores Not difficult at all    Not difficult at all      08/09/2023    3:23 PM  GAD 7 : Generalized Anxiety Score  Nervous, Anxious, on Edge 0  Control/stop worrying 0  Worry too much - different things 0  Trouble relaxing 0  Restless 0  Easily annoyed or irritable 0  Afraid - awful might happen 1  Total GAD 7 Score 1  Anxiety Difficulty Somewhat difficult     GAD Paxil 20 mg daily  Does well with this  Wants to continue it     HTN bp is stable today  No cp or palpitations or headaches or edema  No side effects to medicines  BP Readings from  Last 3 Encounters:  08/09/23 (!) 146/74  04/15/22 131/82  03/24/21 126/70    Lisinopril 10 mg daily   Lab Results  Component Value Date   NA 138 04/06/2022   K 4.6 04/06/2022   CO2 30 04/06/2022   GLUCOSE 100 (H) 04/06/2022   BUN 19 04/06/2022   CREATININE 1.05 04/06/2022   CALCIUM 9.1 04/06/2022   GFR 53.61 (L) 04/06/2022   GFRNONAA 61.81 10/26/2009    Glucose mildly elevated in past   Hyperlipidemia Lab Results  Component Value Date   CHOL 210 (H) 04/06/2022   HDL 87.00 04/06/2022   LDLCALC 107 (H) 04/06/2022   LDLDIRECT 119.6 10/26/2009   TRIG 80.0 04/06/2022   CHOLHDL 2 04/06/2022   Good and bad days for eating  With husband's health       Patient Active Problem List   Diagnosis Date Noted   Screening mammogram for breast cancer 08/09/2023   Poor balance 08/09/2023   Skin cancer screening 08/09/2023   Elevated glucose 04/15/2022   Osteopenia 04/02/2019   Estrogen deficiency 01/31/2018    Essential hypertension 01/31/2018   Colon cancer screening 10/26/2016   Routine general medical examination at a health care facility 12/25/2012   Encounter for routine gynecological examination 12/25/2012   Hyperlipidemia 08/25/2009   Generalized anxiety disorder 01/27/2009   Past Medical History:  Diagnosis Date   Anxiety    Other and unspecified hyperlipidemia    Past Surgical History:  Procedure Laterality Date   BREAST BIOPSY Right    BREAST EXCISIONAL BIOPSY Right    COLONOSCOPY  2009   Social History   Tobacco Use   Smoking status: Former   Smokeless tobacco: Never   Tobacco comments:    over 30 years ago  Vaping Use   Vaping status: Never Used  Substance Use Topics   Alcohol use: Yes    Alcohol/week: 0.0 standard drinks of alcohol    Comment: 7 drinks/week   Drug use: No   Family History  Problem Relation Age of Onset   Cancer Mother    Depression Sister    Breast cancer Sister 73   Anxiety disorder Sister    Ovarian cancer Maternal Aunt    Heart disease Other        GP   Stroke Other        GP   Brain cancer Other        Parent  +smoker   Allergies  Allergen Reactions   Codeine    Current Outpatient Medications on File Prior to Visit  Medication Sig Dispense Refill   lisinopril (ZESTRIL) 10 MG tablet TAKE 1 TABLET BY MOUTH EVERY DAY (Patient taking differently: Take 20 mg by mouth daily.) 90 tablet 0   PARoxetine (PAXIL) 20 MG tablet TAKE 1 TABLET BY MOUTH EVERY DAY IN THE MORNING 90 tablet 0   VITAMIN D, CHOLECALCIFEROL, PO Take 1 capsule by mouth daily.     No current facility-administered medications on file prior to visit.    Review of Systems  Constitutional:  Negative for activity change, appetite change, fatigue, fever and unexpected weight change.  HENT:  Negative for congestion, ear pain, rhinorrhea, sinus pressure and sore throat.   Eyes:  Negative for pain, redness and visual disturbance.  Respiratory:  Negative for cough, shortness  of breath and wheezing.   Cardiovascular:  Negative for chest pain and palpitations.  Gastrointestinal:  Negative for abdominal pain, blood in stool, constipation and diarrhea.  Endocrine: Negative for polydipsia  and polyuria.  Genitourinary:  Negative for dysuria, frequency and urgency.  Musculoskeletal:  Negative for arthralgias, back pain and myalgias.  Skin:  Negative for pallor and rash.  Allergic/Immunologic: Negative for environmental allergies.  Neurological:  Negative for dizziness, syncope and headaches.  Hematological:  Negative for adenopathy. Does not bruise/bleed easily.  Psychiatric/Behavioral:  Negative for decreased concentration and dysphoric mood. The patient is not nervous/anxious.        Objective:   Physical Exam Constitutional:      General: She is not in acute distress.    Appearance: Normal appearance. She is well-developed and normal weight. She is not ill-appearing or diaphoretic.  HENT:     Head: Normocephalic and atraumatic.     Right Ear: Tympanic membrane, ear canal and external ear normal.     Left Ear: Tympanic membrane, ear canal and external ear normal.     Nose: Nose normal. No congestion.     Mouth/Throat:     Mouth: Mucous membranes are moist.     Pharynx: Oropharynx is clear. No posterior oropharyngeal erythema.  Eyes:     General: No scleral icterus.    Extraocular Movements: Extraocular movements intact.     Conjunctiva/sclera: Conjunctivae normal.     Pupils: Pupils are equal, round, and reactive to light.  Neck:     Thyroid: No thyromegaly.     Vascular: No carotid bruit or JVD.  Cardiovascular:     Rate and Rhythm: Normal rate and regular rhythm.     Pulses: Normal pulses.     Heart sounds: Normal heart sounds.     No gallop.  Pulmonary:     Effort: Pulmonary effort is normal. No respiratory distress.     Breath sounds: Normal breath sounds. No wheezing.     Comments: Good air exch Chest:     Chest wall: No tenderness.   Abdominal:     General: Bowel sounds are normal. There is no distension or abdominal bruit.     Palpations: Abdomen is soft. There is no mass.     Tenderness: There is no abdominal tenderness.     Hernia: No hernia is present.  Genitourinary:    Comments: Breast exam: No mass, nodules, thickening, tenderness, bulging, retraction, inflamation, nipple discharge or skin changes noted.  No axillary or clavicular LA.     Musculoskeletal:        General: No tenderness. Normal range of motion.     Cervical back: Normal range of motion and neck supple. No rigidity. No muscular tenderness.     Right lower leg: No edema.     Left lower leg: No edema.     Comments: No kyphosis   Lymphadenopathy:     Cervical: No cervical adenopathy.  Skin:    General: Skin is warm and dry.     Coloration: Skin is not pale.     Findings: No erythema or rash.     Comments: Solar lentigines diffusely  Some scattered sks   Some healed wounds/scabs on anterior lower legs     Neurological:     Mental Status: She is alert. Mental status is at baseline.     Cranial Nerves: No cranial nerve deficit.     Motor: No abnormal muscle tone.     Coordination: Coordination normal.     Gait: Gait normal.     Deep Tendon Reflexes: Reflexes are normal and symmetric. Reflexes normal.  Psychiatric:        Mood and Affect: Mood  normal.        Cognition and Memory: Cognition and memory normal.           Assessment & Plan:   Problem List Items Addressed This Visit       Cardiovascular and Mediastinum   Essential hypertension   bp is elevated today/per pt possibly not well controlled at home  BP Readings from Last 1 Encounters:  08/09/23 (!) 146/74   Plan to increase lisinopril from 10 to 20 mg daily and follow up 1 mo  Instructed to call if side effects or problems  Labs ordered  Disc lifstyle change with low sodium diet and exercise        Relevant Orders   TSH   Lipid Panel   Comprehensive metabolic  panel   CBC with Differential/Platelet     Musculoskeletal and Integument   Osteopenia   Dexa due next month-ordered  Discussed fall prevention, supplements and exercise for bone density   Great exercise habits No falls or fracture  Does c/o poor balance- ref to PT for this         Other   Skin cancer screening   Ref to derm (hers retired)  Has history of AKs  Encouraged sun protection use       Relevant Orders   Ambulatory referral to Dermatology   Screening mammogram for breast cancer   Mammogram ordered Pt will call to schedule       Relevant Orders   MM 3D SCREENING MAMMOGRAM BILATERAL BREAST   Routine general medical examination at a health care facility - Primary   Reviewed health habits including diet and exercise and skin cancer prevention Reviewed appropriate screening tests for age  Also reviewed health mt list, fam hx and immunization status , as well as social and family history   See HPI Labs reviewed and ordered Health Maintenance  Topic Date Due   COVID-19 Vaccine (6 - Pfizer risk 2024-25 season) 08/24/2025*   Hepatitis C Screening  08/08/2033*   Mammogram  09/20/2023   Medicare Annual Wellness Visit  08/08/2024   Colon Cancer Screening  11/26/2026   DTaP/Tdap/Td vaccine (3 - Td or Tdap) 02/20/2029   Pneumonia Vaccine  Completed   Flu Shot  Completed   DEXA scan (bone density measurement)  Completed   Zoster (Shingles) Vaccine  Completed   HPV Vaccine  Aged Out  *Topic was postponed. The date shown is not the original due date.   Dexa and mammo ordered  Ref to derm for skin cancer screening  Ref to PT for balance  PHQ 1 Labs ordered       Poor balance   No falls Worried about falling Referred to PT   Encouraged to keep up her water exercise that includes strength training       Relevant Orders   Ambulatory referral to Physical Therapy   Hyperlipidemia   Disc goals for lipids and reasons to control them Rev last labs with pt Rev low  sat fat diet in detail  Labs today  Diet controlled More processed meals lately / does use air fryer       Generalized anxiety disorder   Still doing well with paxil 20 mg daily and wants to continue it  Encouraged good self care Not currently in counseling  Some stress lately but handling it well GAD7 score of 1      Estrogen deficiency   Dexa ordered       Relevant Orders  DG Bone Density   Elevated glucose   A1c ordered disc imp of low glycemic diet and wt loss to prevent DM2       Relevant Orders   Hemoglobin A1c   Colon cancer screening   Normal colonoscopy 11/2016 with10 y recall

## 2023-08-09 NOTE — Assessment & Plan Note (Signed)
 bp is elevated today/per pt possibly not well controlled at home  BP Readings from Last 1 Encounters:  08/09/23 (!) 146/74   Plan to increase lisinopril from 10 to 20 mg daily and follow up 1 mo  Instructed to call if side effects or problems  Labs ordered  Disc lifstyle change with low sodium diet and exercise

## 2023-08-09 NOTE — Assessment & Plan Note (Addendum)
 Still doing well with paxil 20 mg daily and wants to continue it  Encouraged good self care Not currently in counseling  Some stress lately but handling it well GAD7 score of 1

## 2023-08-10 LAB — CBC WITH DIFFERENTIAL/PLATELET
Basophils Absolute: 0.1 10*3/uL (ref 0.0–0.1)
Basophils Relative: 0.8 % (ref 0.0–3.0)
Eosinophils Absolute: 0.1 10*3/uL (ref 0.0–0.7)
Eosinophils Relative: 1.7 % (ref 0.0–5.0)
HCT: 40.7 % (ref 36.0–46.0)
Hemoglobin: 13.6 g/dL (ref 12.0–15.0)
Lymphocytes Relative: 23.6 % (ref 12.0–46.0)
Lymphs Abs: 1.6 10*3/uL (ref 0.7–4.0)
MCHC: 33.4 g/dL (ref 30.0–36.0)
MCV: 100 fl (ref 78.0–100.0)
Monocytes Absolute: 0.5 10*3/uL (ref 0.1–1.0)
Monocytes Relative: 7.1 % (ref 3.0–12.0)
Neutro Abs: 4.5 10*3/uL (ref 1.4–7.7)
Neutrophils Relative %: 66.8 % (ref 43.0–77.0)
Platelets: 249 10*3/uL (ref 150.0–400.0)
RBC: 4.07 Mil/uL (ref 3.87–5.11)
RDW: 13 % (ref 11.5–15.5)
WBC: 6.8 10*3/uL (ref 4.0–10.5)

## 2023-08-10 LAB — COMPREHENSIVE METABOLIC PANEL WITH GFR
ALT: 20 U/L (ref 0–35)
AST: 19 U/L (ref 0–37)
Albumin: 4.2 g/dL (ref 3.5–5.2)
Alkaline Phosphatase: 65 U/L (ref 39–117)
BUN: 24 mg/dL — ABNORMAL HIGH (ref 6–23)
CO2: 27 meq/L (ref 19–32)
Calcium: 9.2 mg/dL (ref 8.4–10.5)
Chloride: 104 meq/L (ref 96–112)
Creatinine, Ser: 1.1 mg/dL (ref 0.40–1.20)
GFR: 50.22 mL/min — ABNORMAL LOW (ref >60.00)
Glucose, Bld: 88 mg/dL (ref 70–99)
Potassium: 4 meq/L (ref 3.5–5.1)
Sodium: 139 meq/L (ref 135–145)
Total Bilirubin: 0.5 mg/dL (ref 0.2–1.2)
Total Protein: 6.7 g/dL (ref 6.0–8.3)

## 2023-08-10 LAB — LIPID PANEL
Cholesterol: 208 mg/dL — ABNORMAL HIGH (ref 0–200)
HDL: 80.6 mg/dL (ref >39.00)
LDL Cholesterol: 109 mg/dL — ABNORMAL HIGH (ref 0–99)
NonHDL: 127.88
Total CHOL/HDL Ratio: 3
Triglycerides: 93 mg/dL (ref 0.0–149.0)
VLDL: 18.6 mg/dL (ref 0.0–40.0)

## 2023-08-10 LAB — HEMOGLOBIN A1C: Hgb A1c MFr Bld: 5.3 % (ref 4.6–6.5)

## 2023-08-11 ENCOUNTER — Encounter: Payer: Self-pay | Admitting: *Deleted

## 2023-08-11 LAB — TSH: TSH: 3.56 u[IU]/mL (ref 0.35–5.50)

## 2023-08-30 NOTE — Therapy (Signed)
 OUTPATIENT PHYSICAL THERAPY EVALUATION   Patient Name: Caitlin Perez MRN: [id number] DOB:10-02-1950, 73 y.o., female Today's Date: 08/31/2023  END OF SESSION:  PT End of Session - 08/31/23 1614     Visit Number 1    Number of Visits 17    Date for PT Re-Evaluation 10/26/23    Authorization Type aetna medicare    Progress Note Due on Visit 10    PT Start Time 1615    PT Stop Time 1659    PT Time Calculation (min) 44 min    Activity Tolerance Patient tolerated treatment well             Past Medical History:  Diagnosis Date   Anxiety    Other and unspecified hyperlipidemia    Past Surgical History:  Procedure Laterality Date   BREAST BIOPSY Right    BREAST EXCISIONAL BIOPSY Right    COLONOSCOPY  2009   Patient Active Problem List   Diagnosis Date Noted   Screening mammogram for breast cancer 08/09/2023   Poor balance 08/09/2023   Skin cancer screening 08/09/2023   Elevated glucose 04/15/2022   Osteopenia 04/02/2019   Estrogen deficiency 01/31/2018   Essential hypertension 01/31/2018   Colon cancer screening 10/26/2016   Routine general medical examination at a health care facility 12/25/2012   Encounter for routine gynecological examination 12/25/2012   Hyperlipidemia 08/25/2009   Generalized anxiety disorder 01/27/2009    PCP: Clemens Curt, MD  REFERRING PROVIDER: Clemens Curt, MD  REFERRING DIAG: R26.89 (ICD-10-CM) - Poor balance  Rationale for Evaluation and Treatment: Rehabilitation  THERAPY DIAG:  Unsteadiness on feet  ONSET DATE: slowly over past several years  SUBJECTIVE:                                                                                                                                                                                           SUBJECTIVE STATEMENT: Difficulty with navigating uneven ground, less stable in mornings. Walks dogs in the mornings. Has difficulty with reactive balance (dogs brushing her, pulling  at leash). More difficulty with descending stairs, decline surfaces. Does water fitness classes. Has tried to take some of the balance work from pool onto land, feels it is improving, was initially quite difficult. Enjoys walking (about 10 miles a week), gardening, reading books on porch. Difficulty with navigating garden (uneven ground, obstacles) and doing floor transfers.  Denies dizziness, visual changes, headaches, N/T.   PERTINENT HISTORY:  anxiety, HTN, osteopenia  PAIN:  Denies any significant issues with pain. Hx L humeral fracture she states has healed quite well  PRECAUTIONS: osteopenia, fall risk  WEIGHT BEARING  RESTRICTIONS: No  FALLS:  Has patient fallen in last 6 months? No - endorses near falls  LIVING ENVIRONMENT: 1 story + basement with rail; 2STE with one rail difficulty with stair navigation, tends to walk around in yard to reach basement Lives w/ husband and dogs  OCCUPATION: retired - used to [employer], Audiological scientist  PLOF: Independent  PATIENT GOALS: preserving function, get better with stairs, gardening  NEXT MD VISIT: 09/11/23   OBJECTIVE:  Note: Objective measures were completed at Evaluation unless otherwise noted.  PATIENT SURVEYS:  NT  COGNITION: Overall cognitive status: Within functional limits for tasks assessed     SENSATION: Denies sensory complaints  POSTURE: WNL  STRENGTH TESTING:  MMT Right eval Left eval  Shoulder flexion    Shoulder abduction    Elbow flexion    Elbow extension    Grip strength (gross)    Hip flexion 5 5  Hip abduction (modified sitting) 5 5  Knee flexion 5 5  Knee extension 5 5  Ankle dorsiflexion 5 5  Ankle plantarflexion     (Blank rows = not tested) (Key: WFL = within functional limits not formally assessed, * = concordant pain, s = stiffness/stretching sensation, NT = not tested)  Comments:     FUNCTIONAL TESTS:  30secSTS: 15 repetitions SLS: RLE 9 sec LLE 4sec  GAIT: Distance walked:  within clinic Assistive device utilized: None Level of assistance: Complete Independence Comments: mildly reduced truncal rotation and arm swing   Evaluation FGA:  -Item 1 Gait Level Surface: Normal 3  -Item 2 Change in Gait Speed: mild impairment 2  -Item 3 Gait with Horizontal Head Turns: mild impairment 2  -Item 4 Gait with Vertical Head Turns: Normal 3  -Item 5 Gait with Pivot Turn: Normal 3  -Item 6 Step Over Obstacle: Normal 3  -Item 7 Gait with Narrow Base of Support: severe impairment 0 -Item 8 Gait with Eyes Closed: moderate impairment 1  -Item 9 Ambulating Backwards: mild impairment 2 -Item 10 Steps: moderate impairment 1  (self reported) Total: 20 /30  * Score of <=22/30 indicates that patient is at increased risk for falls.    TREATMENT DATE:  [facility] Adult PT Treatment:                                                DATE: 08/31/23 Therapeutic Exercise: STS, slow march at counter; practice reps + education/handout  Self Care: Fall risk reduction strategies, balance assessment findings and strategies to address, rationale for interventions and relevant anatomy/physiology, reinforced positive exercises habits with strategies for improving efficiency/safety                                                                                                                                     PATIENT  EDUCATION:  Education details: Pt education on PT impairments, prognosis, and POC. Informed consent. Rationale for interventions, safe/appropriate HEP performance, self care as above Person educated: Patient Education method: Explanation, Demonstration, Tactile cues, Verbal cues Education comprehension: verbalized understanding, returned demonstration, verbal cues required, tactile cues required, and needs further education    HOME EXERCISE PROGRAM: Access Code: [email or web address] URL: [email or web address] Date: 08/31/2023 Prepared by: Fransisco Hertz  Exercises - Sit to  Stand  - 2-3 x daily - 1 sets - 10 reps - Standing March with Counter Support  - 2-3 x daily - 1 sets - 10 reps  ASSESSMENT:  CLINICAL IMPRESSION: Patient is a pleasant 73 y.o. woman who was seen today for physical therapy evaluation and treatment for balance. She endorses longstanding balance issues gradually worsening, non-limiting but tends to require increased time/effort with uneven surfaces and more complex environments. Also endorses difficulty with reactive balance (pets). On exam she demonstrates good LE strength. FGA scored at 20/30 which is just within fall risk category, noting heavy reliance on visual input for postural stability and difficulty with dual tasking. Also demonstrates reduced stability with single leg stance. No adverse events, pt tolerates session quite well and demos safe performance of HEP. Self care education provided as above.  Recommend trial of skilled PT to address aforementioned deficits with aim of improving functional tolerance and reducing pain with typical activities. Pt departs today's session in no acute distress, all voiced concerns/questions addressed appropriately from PT perspective.      OBJECTIVE IMPAIRMENTS: Abnormal gait, decreased balance, decreased mobility, difficulty walking, and decreased strength.   ACTIVITY LIMITATIONS: carrying, lifting, standing, stairs, and locomotion level  PARTICIPATION LIMITATIONS: community activity  PERSONAL FACTORS: Age, Time since onset of injury/illness/exacerbation, and 3+ comorbidities: anxiety, HTN, osteopenia  are also affecting patient's functional outcome.   REHAB POTENTIAL: Good  CLINICAL DECISION MAKING: Stable/uncomplicated  EVALUATION COMPLEXITY: Low   GOALS:   SHORT TERM GOALS: Target date: 09/28/2023  Pt will demonstrate appropriate understanding and performance of initially prescribed HEP in order to facilitate improved independence with management of symptoms.  Baseline: HEP established   Goal status: INITIAL   2. Pt will endorse ability to safely navigate 1 flight of stairs with rail and reciprocal pattern in order to improve home navigation.   Baseline: rails, variable reciprocal/step to pattern, subjective instability  Goal status: INITIAL    LONG TERM GOALS: Target date: 10/26/2023   Pt will be able to perform floor transfer without UE support and report of no more than 3/10 RPE in order to improve safety w/ recreational/home tasks. Baseline: inc time/difficulty with floor transfers while gardening Goal status: INITIAL  2.  Pt will demonstrate BIL SLS time of at least 20 sec BIL to indicate improved postural stability.   Baseline: RLE 9 sec LLE 4sec  Goal status: INITIAL   3.  Pt will score greater than or equal to 26/30 on Functional Gait assessment in order to indicate reduced fall risk (cutoff score </= 22/30 predictive of falls per Alvino Chapel et al 2010, MCID 4 pts Beninato et al 2014)  Baseline: 20  Goal status: INITIAL   4. Pt will be able to perform at least 17 repetitions during 30 second sit to stand test in order to demonstrate improved exercise/activity tolerance (cutoff score for low exercise tolerance 18 repetitions in males and 16 repetitions in females per Georgina Snell al 2024)  Baseline: 15 repetitions, min UE support from thighs at times  Goal status: INITIAL  5. Pt will demonstrate appropriate performance of final prescribed HEP in order to facilitate improved self-management of symptoms post-discharge.   Baseline: initial HEP prescribed  Goal status: INITIAL      PLAN:  PT FREQUENCY: 1-2x/week  PT DURATION: 8 weeks  PLANNED INTERVENTIONS: 97164- PT Re-evaluation, 97110-Therapeutic exercises, 97530- Therapeutic activity, 97112- Neuromuscular re-education, 97535- Self Care, 16109- Manual therapy, [phone]- Gait training, Patient/Family education, Balance training, Stair training, Taping, Cryotherapy, and Moist heat.  PLAN FOR NEXT SESSION:  Review/update HEP PRN. Work on [employer] exercises as appropriate with emphasis on dual tasking, proprioception, reactive stability. Working towards functional goals of stair navigation and gardening (floor transfers, uneven surfaces). Symptom modification strategies as indicated/appropriate.    Lovett Ruck PT, DPT 08/31/2023 5:12 PM

## 2023-08-31 ENCOUNTER — Encounter: Payer: Self-pay | Admitting: Physical Therapy

## 2023-08-31 ENCOUNTER — Other Ambulatory Visit: Payer: Self-pay

## 2023-08-31 ENCOUNTER — Ambulatory Visit: Attending: Family Medicine | Admitting: Physical Therapy

## 2023-08-31 DIAGNOSIS — R2689 Other abnormalities of gait and mobility: Secondary | ICD-10-CM | POA: Insufficient documentation

## 2023-08-31 DIAGNOSIS — R2681 Unsteadiness on feet: Secondary | ICD-10-CM | POA: Insufficient documentation

## 2023-09-05 ENCOUNTER — Ambulatory Visit

## 2023-09-05 DIAGNOSIS — R2681 Unsteadiness on feet: Secondary | ICD-10-CM

## 2023-09-05 DIAGNOSIS — R2689 Other abnormalities of gait and mobility: Secondary | ICD-10-CM | POA: Diagnosis not present

## 2023-09-05 NOTE — Therapy (Addendum)
 OUTPATIENT PHYSICAL THERAPY NOTE   Patient Name: Caitlin Perez MRN: [id number] DOB:Jan 21, 1951, 73 y.o., female Today's Date: 09/05/2023  END OF SESSION:  PT End of Session - 09/05/23 1657     Visit Number 2    Number of Visits 17    Date for PT Re-Evaluation 10/26/23    Authorization Type aetna medicare    Progress Note Due on Visit 10    PT Start Time 1702    PT Stop Time 1743    PT Time Calculation (min) 41 min    Activity Tolerance Patient tolerated treatment well    Behavior During Therapy [facility] for tasks assessed/performed              Past Medical History:  Diagnosis Date   Anxiety    Other and unspecified hyperlipidemia    Past Surgical History:  Procedure Laterality Date   BREAST BIOPSY Right    BREAST EXCISIONAL BIOPSY Right    COLONOSCOPY  2009   Patient Active Problem List   Diagnosis Date Noted   Screening mammogram for breast cancer 08/09/2023   Poor balance 08/09/2023   Skin cancer screening 08/09/2023   Elevated glucose 04/15/2022   Osteopenia 04/02/2019   Estrogen deficiency 01/31/2018   Essential hypertension 01/31/2018   Colon cancer screening 10/26/2016   Routine general medical examination at a health care facility 12/25/2012   Encounter for routine gynecological examination 12/25/2012   Hyperlipidemia 08/25/2009   Generalized anxiety disorder 01/27/2009    PCP: Clemens Curt, MD  REFERRING PROVIDER: Clemens Curt, MD  REFERRING DIAG: R26.89 (ICD-10-CM) - Poor balance  Rationale for Evaluation and Treatment: Rehabilitation  THERAPY DIAG:  Unsteadiness on feet  ONSET DATE: slowly over past several years  SUBJECTIVE:                                                                                                                                                                                           SUBJECTIVE STATEMENT: 09/05/2023 Patient reports that most of her difficulty is with uneven surfaces and stairs. No worsening  of condition reported since last visit.   EVAL: Difficulty with navigating uneven ground, less stable in mornings. Walks dogs in the mornings. Has difficulty with reactive balance (dogs brushing her, pulling at leash). More difficulty with descending stairs, decline surfaces. Does water fitness classes. Has tried to take some of the balance work from pool onto land, feels it is improving, was initially quite difficult. Enjoys walking (about 10 miles a week), gardening, reading books on porch. Difficulty with navigating garden (uneven ground, obstacles) and doing floor transfers.  Denies dizziness, visual changes, headaches,  N/T.   PERTINENT HISTORY:  anxiety, HTN, osteopenia  PAIN:  Denies any significant issues with pain. Hx L humeral fracture she states has healed quite well  PRECAUTIONS: osteopenia, fall risk  WEIGHT BEARING RESTRICTIONS: No  FALLS:  Has patient fallen in last 6 months? No - endorses near falls  LIVING ENVIRONMENT: 1 story + basement with rail; 2STE with one rail difficulty with stair navigation, tends to walk around in yard to reach basement Lives w/ husband and dogs  OCCUPATION: retired - used to [employer], Audiological scientist  PLOF: Independent  PATIENT GOALS: preserving function, get better with stairs, gardening  NEXT MD VISIT: 09/11/23   OBJECTIVE:  Note: Objective measures were completed at Evaluation unless otherwise noted.  PATIENT SURVEYS:  NT  COGNITION: Overall cognitive status: Within functional limits for tasks assessed     SENSATION: Denies sensory complaints  POSTURE: WNL  STRENGTH TESTING:  MMT Right eval Left eval  Shoulder flexion    Shoulder abduction    Elbow flexion    Elbow extension    Grip strength (gross)    Hip flexion 5 5  Hip abduction (modified sitting) 5 5  Knee flexion 5 5  Knee extension 5 5  Ankle dorsiflexion 5 5  Ankle plantarflexion     (Blank rows = not tested) (Key: WFL = within functional limits not  formally assessed, * = concordant pain, s = stiffness/stretching sensation, NT = not tested)  Comments:     FUNCTIONAL TESTS:  30secSTS: 15 repetitions SLS: RLE 9 sec LLE 4sec  GAIT: Distance walked: within clinic Assistive device utilized: None Level of assistance: Complete Independence Comments: mildly reduced truncal rotation and arm swing   Evaluation FGA:  -Item 1 Gait Level Surface: Normal 3  -Item 2 Change in Gait Speed: mild impairment 2  -Item 3 Gait with Horizontal Head Turns: mild impairment 2  -Item 4 Gait with Vertical Head Turns: Normal 3  -Item 5 Gait with Pivot Turn: Normal 3  -Item 6 Step Over Obstacle: Normal 3  -Item 7 Gait with Narrow Base of Support: severe impairment 0 -Item 8 Gait with Eyes Closed: moderate impairment 1  -Item 9 Ambulating Backwards: mild impairment 2 -Item 10 Steps: moderate impairment 1  (self reported) Total: 20 /30  * Score of <=22/30 indicates that patient is at increased risk for falls.    TREATMENT DATE:     [facility] Adult PT Treatment:                                                DATE: 09/05/2023  Therapeutic Exercise:  6" step ups lateral, 2 x 10 each  6" step ups forward, 2 x 10 each  STS 2 x 5   Neuromuscular Reed:  Lateral walking on airex beam x 3 laps with // bars  Lateral walking on airex beam with heel hang x 2 laps with // bars Lateral walking on airex beam with toe hang x 2 laps with // bars Tandem walking on airex beam x 3 laps with // bars  Cone taps from standing on airex pad (2 cones) Tandem stance on ground, 2 x 30 sec each  Feet together with varied perturbations from clinician, gait belt used for safety 1 round with eyes open, 1 round with eyes closed    [facility] Adult PT Treatment:  DATE: 08/31/23 Therapeutic Exercise: STS, slow march at counter; practice reps + education/handout  Self Care: Fall risk reduction strategies, balance assessment findings and  strategies to address, rationale for interventions and relevant anatomy/physiology, reinforced positive exercises habits with strategies for improving efficiency/safety                                                                                                                                     PATIENT EDUCATION:  Education details: Pt education on PT impairments, prognosis, and POC. Informed consent. Rationale for interventions, safe/appropriate HEP performance, self care as above Person educated: Patient Education method: Explanation, Demonstration, Tactile cues, Verbal cues Education comprehension: verbalized understanding, returned demonstration, verbal cues required, tactile cues required, and needs further education    HOME EXERCISE PROGRAM: Access Code: [email or web address] URL: [email or web address] Date: 08/31/2023 Prepared by: Mayme Spearman  Exercises - Sit to Stand  - 2-3 x daily - 1 sets - 10 reps - Standing March with Counter Support  - 2-3 x daily - 1 sets - 10 reps  ASSESSMENT:  CLINICAL IMPRESSION: Toy had fair tolerance of today's treatment session, which focused on anticipatory balance, ambulation and functional movements. Patient had some difficulty with initial reactionary balance interventions at start of session. We will continue to progress per POC as tolerated, in order to reach established rehab goals.    EVAL: Patient is a pleasant 73 y.o. woman who was seen today for physical therapy evaluation and treatment for balance. She endorses longstanding balance issues gradually worsening, non-limiting but tends to require increased time/effort with uneven surfaces and more complex environments. Also endorses difficulty with reactive balance (pets). On exam she demonstrates good LE strength. FGA scored at 20/30 which is just within fall risk category, noting heavy reliance on visual input for postural stability and difficulty with dual tasking. Also  demonstrates reduced stability with single leg stance. No adverse events, pt tolerates session quite well and demos safe performance of HEP. Self care education provided as above.  Recommend trial of skilled PT to address aforementioned deficits with aim of improving functional tolerance and reducing pain with typical activities. Pt departs today's session in no acute distress, all voiced concerns/questions addressed appropriately from PT perspective.      OBJECTIVE IMPAIRMENTS: Abnormal gait, decreased balance, decreased mobility, difficulty walking, and decreased strength.   ACTIVITY LIMITATIONS: carrying, lifting, standing, stairs, and locomotion level  PARTICIPATION LIMITATIONS: community activity  PERSONAL FACTORS: Age, Time since onset of injury/illness/exacerbation, and 3+ comorbidities: anxiety, HTN, osteopenia  are also affecting patient's functional outcome.   REHAB POTENTIAL: Good  CLINICAL DECISION MAKING: Stable/uncomplicated  EVALUATION COMPLEXITY: Low   GOALS:   SHORT TERM GOALS: Target date: 09/28/2023  Pt will demonstrate appropriate understanding and performance of initially prescribed HEP in order to facilitate improved independence with management of symptoms.  Baseline: HEP established  Goal status: INITIAL   2. Pt will endorse ability to safely navigate  1 flight of stairs with rail and reciprocal pattern in order to improve home navigation.   Baseline: rails, variable reciprocal/step to pattern, subjective instability  Goal status: INITIAL    LONG TERM GOALS: Target date: 10/26/2023   Pt will be able to perform floor transfer without UE support and report of no more than 3/10 RPE in order to improve safety w/ recreational/home tasks. Baseline: inc time/difficulty with floor transfers while gardening Goal status: INITIAL  2.  Pt will demonstrate BIL SLS time of at least 20 sec BIL to indicate improved postural stability.   Baseline: RLE 9 sec LLE 4sec  Goal  status: INITIAL   3.  Pt will score greater than or equal to 26/30 on Functional Gait assessment in order to indicate reduced fall risk (cutoff score </= 22/30 predictive of falls per Avelina Leitz et al 2010, MCID 4 pts Beninato et al 2014)  Baseline: 20  Goal status: INITIAL   4. Pt will be able to perform at least 17 repetitions during 30 second sit to stand test in order to demonstrate improved exercise/activity tolerance (cutoff score for low exercise tolerance 18 repetitions in males and 16 repetitions in females per Jolie Neat al 2024)  Baseline: 15 repetitions, min UE support from thighs at times  Goal status: INITIAL    5. Pt will demonstrate appropriate performance of final prescribed HEP in order to facilitate improved self-management of symptoms post-discharge.   Baseline: initial HEP prescribed  Goal status: INITIAL      PLAN:  PT FREQUENCY: 1-2x/week  PT DURATION: 8 weeks  PLANNED INTERVENTIONS: 97164- PT Re-evaluation, 97110-Therapeutic exercises, 97530- Therapeutic activity, 97112- Neuromuscular re-education, 97535- Self Care, 40981- Manual therapy, [phone]- Gait training, Patient/Family education, Balance training, Stair training, Taping, Cryotherapy, and Moist heat.  PLAN FOR NEXT SESSION: Review/update HEP PRN. Work on [employer] exercises as appropriate with emphasis on dual tasking, proprioception, reactive stability. Working towards functional goals of stair navigation and gardening (floor transfers, uneven surfaces). Symptom modification strategies as indicated/appropriate.    Arlester Bence, PT, DPT  09/07/2023 2:18 PM

## 2023-09-07 ENCOUNTER — Ambulatory Visit

## 2023-09-07 DIAGNOSIS — R2681 Unsteadiness on feet: Secondary | ICD-10-CM | POA: Diagnosis not present

## 2023-09-07 DIAGNOSIS — R2689 Other abnormalities of gait and mobility: Secondary | ICD-10-CM | POA: Diagnosis not present

## 2023-09-07 NOTE — Therapy (Addendum)
 OUTPATIENT PHYSICAL THERAPY NOTE  Discharge Summary Visits from Start of Care: 3  Current functional level related to goals / functional outcomes: See assessment   Remaining deficits: See assessment, current status unknown.   Education / Equipment: HEP   Patient agrees to discharge. Patient goals were not met. Patient is being discharged due to not returning since the last visit.  Joneen Fresh PT, DPT, LAT, ATC  03/13/24  9:00 AM    Patient Name: Caitlin Perez MRN: [id number] DOB:May 29, 1950, 73 y.o., female Today's Date: 09/07/2023  END OF SESSION:   PT End of Session - 09/05/23 1657       Visit Number 3    Number of Visits 17     Date for PT Re-Evaluation 10/26/23     Authorization Type aetna medicare     Progress Note Due on Visit 10     PT Start Time 1703    PT Stop Time 1741    PT Time Calculation (min) 38 min          Past Medical History:  Diagnosis Date   Anxiety    Other and unspecified hyperlipidemia    Past Surgical History:  Procedure Laterality Date   BREAST BIOPSY Right    BREAST EXCISIONAL BIOPSY Right    COLONOSCOPY  2009   Patient Active Problem List   Diagnosis Date Noted   Screening mammogram for breast cancer 08/09/2023   Poor balance 08/09/2023   Skin cancer screening 08/09/2023   Elevated glucose 04/15/2022   Osteopenia 04/02/2019   Estrogen deficiency 01/31/2018   Essential hypertension 01/31/2018   Colon cancer screening 10/26/2016   Routine general medical examination at a health care facility 12/25/2012   Encounter for routine gynecological examination 12/25/2012   Hyperlipidemia 08/25/2009   Generalized anxiety disorder 01/27/2009    PCP: Randeen Laine LABOR, MD  REFERRING PROVIDER: Randeen Laine LABOR, MD  REFERRING DIAG: R26.89 (ICD-10-CM) - Poor balance  Rationale for Evaluation and Treatment: Rehabilitation  THERAPY DIAG:  Unsteadiness on feet  ONSET DATE: slowly over past several years  SUBJECTIVE:                                                                                                                                                                                            SUBJECTIVE STATEMENT: 09/07/2023 Patient reports that most of her difficulty is with uneven surfaces and stairs. No worsening of condition reported since last visit.   EVAL: Difficulty with navigating uneven ground, less stable in mornings. Walks dogs in the mornings. Has difficulty with reactive balance (dogs brushing her, pulling at leash). More difficulty with descending stairs,  decline surfaces. Does water fitness classes. Has tried to take some of the balance work from pool onto land, feels it is improving, was initially quite difficult. Enjoys walking (about 10 miles a week), gardening, reading books on porch. Difficulty with navigating garden (uneven ground, obstacles) and doing floor transfers.  Denies dizziness, visual changes, headaches, N/T.   PERTINENT HISTORY:  anxiety, HTN, osteopenia  PAIN:  Denies any significant issues with pain. Hx L humeral fracture she states has healed quite well  PRECAUTIONS: osteopenia, fall risk  WEIGHT BEARING RESTRICTIONS: No  FALLS:  Has patient fallen in last 6 months? No - endorses near falls  LIVING ENVIRONMENT: 1 story + basement with rail; 2STE with one rail difficulty with stair navigation, tends to walk around in yard to reach basement Lives w/ husband and dogs  OCCUPATION: retired - used to [employer], audiological scientist  PLOF: Independent  PATIENT GOALS: preserving function, get better with stairs, gardening  NEXT MD VISIT: 09/11/23   OBJECTIVE:  Note: Objective measures were completed at Evaluation unless otherwise noted.  PATIENT SURVEYS:  NT  COGNITION: Overall cognitive status: Within functional limits for tasks assessed     SENSATION: Denies sensory complaints  POSTURE: WNL  STRENGTH TESTING:  MMT Right eval Left eval  Shoulder flexion     Shoulder abduction    Elbow flexion    Elbow extension    Grip strength (gross)    Hip flexion 5 5  Hip abduction (modified sitting) 5 5  Knee flexion 5 5  Knee extension 5 5  Ankle dorsiflexion 5 5  Ankle plantarflexion     (Blank rows = not tested) (Key: WFL = within functional limits not formally assessed, * = concordant pain, s = stiffness/stretching sensation, NT = not tested)  Comments:     FUNCTIONAL TESTS:  30secSTS: 15 repetitions SLS: RLE 9 sec LLE 4sec  GAIT: Distance walked: within clinic Assistive device utilized: None Level of assistance: Complete Independence Comments: mildly reduced truncal rotation and arm swing   Evaluation FGA:  -Item 1 Gait Level Surface: Normal 3  -Item 2 Change in Gait Speed: mild impairment 2  -Item 3 Gait with Horizontal Head Turns: mild impairment 2  -Item 4 Gait with Vertical Head Turns: Normal 3  -Item 5 Gait with Pivot Turn: Normal 3  -Item 6 Step Over Obstacle: Normal 3  -Item 7 Gait with Narrow Base of Support: severe impairment 0 -Item 8 Gait with Eyes Closed: moderate impairment 1  -Item 9 Ambulating Backwards: mild impairment 2 -Item 10 Steps: moderate impairment 1  (self reported) Total: 20 /30  * Score of <=22/30 indicates that patient is at increased risk for falls.    TREATMENT DATE:    [facility] Adult PT Treatment:                                                DATE: 09/07/2023  Therapeutic Exercise:  6 step ups lateral, 2 x 10 each  6 step ups forward, 2 x 10 each  STS 2 x 5   Neuromuscular Reed:  standing on airex pad, head nods EC Standing on airex pad, head turns EC  Lateral walking on airex beam x 3 laps with // bars  Lateral walking on airex beam with heel hang x 2 laps with // bars Lateral walking on airex beam with toe  hang x 2 laps with // bars Tandem walking on airex beam x 3 laps with // bars  Cone taps from standing on airex pad (2 cones) Tandem stance on ground, 2 x 30 sec each  Feet together  with varied perturbations from clinician, gait belt used for safety several round with eyes closed     [facility] Adult PT Treatment:                                                DATE: 09/05/2023  Therapeutic Exercise:  6 step ups lateral, 2 x 10 each  6 step ups forward, 2 x 10 each  STS 2 x 5   Neuromuscular Reed:  Lateral walking on airex beam x 3 laps with // bars  Lateral walking on airex beam with heel hang x 2 laps with // bars Lateral walking on airex beam with toe hang x 2 laps with // bars Tandem walking on airex beam x 3 laps with // bars  Cone taps from standing on airex pad (2 cones) Tandem stance on ground, 2 x 30 sec each  Feet together with varied perturbations from clinician, gait belt used for safety 1 round with eyes open, 1 round with eyes closed    [facility] Adult PT Treatment:                                                DATE: 08/31/23 Therapeutic Exercise: STS, slow march at counter; practice reps + education/handout  Self Care: Fall risk reduction strategies, balance assessment findings and strategies to address, rationale for interventions and relevant anatomy/physiology, reinforced positive exercises habits with strategies for improving efficiency/safety                                                                                                                                     PATIENT EDUCATION:  Education details: Pt education on PT impairments, prognosis, and POC. Informed consent. Rationale for interventions, safe/appropriate HEP performance, self care as above Person educated: Patient Education method: Explanation, Demonstration, Tactile cues, Verbal cues Education comprehension: verbalized understanding, returned demonstration, verbal cues required, tactile cues required, and needs further education    HOME EXERCISE PROGRAM: Access Code: [email or web address] URL: [email or web address] Date: 08/31/2023 Prepared by: Alm Jenny  Exercises -  Sit to Stand  - 2-3 x daily - 1 sets - 10 reps - Standing March with Counter Support  - 2-3 x daily - 1 sets - 10 reps  ASSESSMENT:  CLINICAL IMPRESSION: Ofilia had good tolerance of today's treatment session, which focused on anticipatory balance, and began progression of reactionary standing balance. She will benefit from progression of  reactionary balance and dynamic balance interventions at future visits.  We will continue to progress per POC as tolerated, in order to reach established rehab goals.    EVAL: Patient is a pleasant 74 y.o. woman who was seen today for physical therapy evaluation and treatment for balance. She endorses longstanding balance issues gradually worsening, non-limiting but tends to require increased time/effort with uneven surfaces and more complex environments. Also endorses difficulty with reactive balance (pets). On exam she demonstrates good LE strength. FGA scored at 20/30 which is just within fall risk category, noting heavy reliance on visual input for postural stability and difficulty with dual tasking. Also demonstrates reduced stability with single leg stance. No adverse events, pt tolerates session quite well and demos safe performance of HEP. Self care education provided as above.  Recommend trial of skilled PT to address aforementioned deficits with aim of improving functional tolerance and reducing pain with typical activities. Pt departs today's session in no acute distress, all voiced concerns/questions addressed appropriately from PT perspective.      OBJECTIVE IMPAIRMENTS: Abnormal gait, decreased balance, decreased mobility, difficulty walking, and decreased strength.   ACTIVITY LIMITATIONS: carrying, lifting, standing, stairs, and locomotion level  PARTICIPATION LIMITATIONS: community activity  PERSONAL FACTORS: Age, Time since onset of injury/illness/exacerbation, and 3+ comorbidities: anxiety, HTN, osteopenia are also affecting patient's functional  outcome.   REHAB POTENTIAL: Good  CLINICAL DECISION MAKING: Stable/uncomplicated  EVALUATION COMPLEXITY: Low   GOALS:   SHORT TERM GOALS: Target date: 09/28/2023  Pt will demonstrate appropriate understanding and performance of initially prescribed HEP in order to facilitate improved independence with management of symptoms.  Baseline: HEP established  Goal status: INITIAL   2. Pt will endorse ability to safely navigate 1 flight of stairs with rail and reciprocal pattern in order to improve home navigation.   Baseline: rails, variable reciprocal/step to pattern, subjective instability  Goal status: INITIAL    LONG TERM GOALS: Target date: 10/26/2023   Pt will be able to perform floor transfer without UE support and report of no more than 3/10 RPE in order to improve safety w/ recreational/home tasks. Baseline: inc time/difficulty with floor transfers while gardening Goal status: INITIAL  2.  Pt will demonstrate BIL SLS time of at least 20 sec BIL to indicate improved postural stability.   Baseline: RLE 9 sec LLE 4sec  Goal status: INITIAL   3.  Pt will score greater than or equal to 26/30 on Functional Gait assessment in order to indicate reduced fall risk (cutoff score </= 22/30 predictive of falls per Willye et al 2010, MCID 4 pts Beninato et al 2014)  Baseline: 20  Goal status: INITIAL   4. Pt will be able to perform at least 17 repetitions during 30 second sit to stand test in order to demonstrate improved exercise/activity tolerance (cutoff score for low exercise tolerance 18 repetitions in males and 16 repetitions in females per Delford dunker al 2024)  Baseline: 15 repetitions, min UE support from thighs at times  Goal status: INITIAL    5. Pt will demonstrate appropriate performance of final prescribed HEP in order to facilitate improved self-management of symptoms post-discharge.   Baseline: initial HEP prescribed  Goal status: INITIAL      PLAN:  PT FREQUENCY:  1-2x/week  PT DURATION: 8 weeks  PLANNED INTERVENTIONS: 97164- PT Re-evaluation, 97110-Therapeutic exercises, 97530- Therapeutic activity, 97112- Neuromuscular re-education, 97535- Self Care, [id number]- Manual therapy, [phone]- Gait training, Patient/Family education, Balance training, Stair training, Taping, Cryotherapy, and Moist heat.  PLAN FOR NEXT SESSION: Review/update HEP PRN. Work on [employer] exercises as appropriate with emphasis on dual tasking, proprioception, reactive stability. Working towards functional goals of stair navigation and gardening (floor transfers, uneven surfaces). Symptom modification strategies as indicated/appropriate.    Marko Molt, PT, DPT  09/12/2023 8:50 AM

## 2023-09-11 ENCOUNTER — Ambulatory Visit (INDEPENDENT_AMBULATORY_CARE_PROVIDER_SITE_OTHER): Admitting: Family Medicine

## 2023-09-11 ENCOUNTER — Encounter: Payer: Self-pay | Admitting: Family Medicine

## 2023-09-11 VITALS — BP 138/65 | HR 65 | Temp 97.9°F | Ht 67.0 in | Wt 162.1 lb

## 2023-09-11 DIAGNOSIS — I10 Essential (primary) hypertension: Secondary | ICD-10-CM | POA: Diagnosis not present

## 2023-09-11 DIAGNOSIS — N1831 Chronic kidney disease, stage 3a: Secondary | ICD-10-CM | POA: Diagnosis not present

## 2023-09-11 MED ORDER — LISINOPRIL 20 MG PO TABS: 20.0000 mg | ORAL_TABLET | Freq: Every day | ORAL | 3 refills | Status: AC

## 2023-09-11 NOTE — Assessment & Plan Note (Signed)
 bp in fair control at this time -improved and coming down with increase in lisinopril  to 20 mg  Tolerating well BP Readings from Last 1 Encounters:  09/11/23 138/65   No changes needed Encouraged more water for renal health  GFR 50.22  Most recent labs reviewed  Disc lifstyle change with low sodium diet and exercise   Follow up 3 months  Consider addn of amlodipine if needed

## 2023-09-11 NOTE — Assessment & Plan Note (Signed)
 GFR 50.22 last check  In setting of HTN  BUN was elevated at 24 Encouraged more water intake Also increase lisinopril  to 20 mg   Will follow up 3 mo for re check and blood pressure check

## 2023-09-11 NOTE — Patient Instructions (Addendum)
 Blood pressure is better on the 2nd check   Try and avoid processed food with lots of sodium   Continue exercise   Keep drinking water for kidney health  Aim for 60 or more oz per day (mostly water)    Stay on lisinopril  20 mg daily

## 2023-09-11 NOTE — Progress Notes (Signed)
 Subjective:    Patient ID: Caitlin Perez, female    DOB: [birth year], 73 y.o.   MRN: [id number]  HPI  Wt Readings from Last 3 Encounters:  09/11/23 162 lb 2 oz (73.5 kg)  08/09/23 161 lb 2 oz (73.1 kg)  04/15/22 167 lb 2 oz (75.8 kg)   25.39 kg/m  Vitals:   09/11/23 1557 09/11/23 1612  BP: (!) 148/78 138/65  Pulse: 65   Temp: 97.9 F (36.6 C)   SpO2: 95%     Pt presents for follow up of HTN and reduced GFR bp is stable today  No cp or palpitations or headaches or edema  No side effects to medicines  BP Readings from Last 3 Encounters:  09/11/23 138/65  08/09/23 (!) 146/74  04/15/22 131/82     Pulse Readings from Last 3 Encounters:  09/11/23 65  08/09/23 72  04/15/22 62     Last visit increased lisinopril  from 10 to 20 mg daily  No side effects     Lab Results  Component Value Date   NA 139 08/09/2023   K 4.0 08/09/2023   CO2 27 08/09/2023   GLUCOSE 88 08/09/2023   BUN 24 (H) 08/09/2023   CREATININE 1.10 08/09/2023   CALCIUM 9.2 08/09/2023   GFR 50.22 (L) 08/09/2023   GFRNONAA 61.81 10/26/2009   GFR down from 53.6 to 50.2   Started PT for balance  [facility]     Patient Active Problem List   Diagnosis Date Noted   Chronic kidney disease (CKD) stage G3a/A1, moderately decreased glomerular filtration rate (GFR) between 45-59 mL/min/1.73 square meter and albuminuria creatinine ratio less than 30 mg/g (HCC) 09/11/2023   Screening mammogram for breast cancer 08/09/2023   Poor balance 08/09/2023   Skin cancer screening 08/09/2023   Elevated glucose 04/15/2022   Osteopenia 04/02/2019   Estrogen deficiency 01/31/2018   Essential hypertension 01/31/2018   Colon cancer screening 10/26/2016   Routine general medical examination at a health care facility 12/25/2012   Encounter for routine gynecological examination 12/25/2012   Hyperlipidemia 08/25/2009   Generalized anxiety disorder 01/27/2009   Past  Medical History:  Diagnosis Date   Anxiety    Other and unspecified hyperlipidemia    Past Surgical History:  Procedure Laterality Date   BREAST BIOPSY Right    BREAST EXCISIONAL BIOPSY Right    COLONOSCOPY  2009   Social History   Tobacco Use   Smoking status: Former   Smokeless tobacco: Never   Tobacco comments:    over 30 years ago  Vaping Use   Vaping status: Never Used  Substance Use Topics   Alcohol use: Yes    Alcohol/week: 0.0 standard drinks of alcohol    Comment: 7 drinks/week   Drug use: No   Family History  Problem Relation Age of Onset   Cancer Mother    Depression Sister    Breast cancer Sister 18   Anxiety disorder Sister    Ovarian cancer Maternal Aunt    Heart disease Other        GP   Stroke Other        GP   Brain cancer Other        Parent  +smoker   Allergies  Allergen Reactions   Codeine    Current Outpatient Medications on File Prior to Visit  Medication Sig Dispense Refill   PARoxetine  (PAXIL ) 20 MG tablet TAKE 1 TABLET  BY MOUTH EVERY DAY IN THE MORNING 90 tablet 0   VITAMIN D, CHOLECALCIFEROL, PO Take 1 capsule by mouth daily.     No current facility-administered medications on file prior to visit.    Review of Systems  Constitutional:  Negative for activity change, appetite change, fatigue, fever and unexpected weight change.  HENT:  Negative for congestion, ear pain, rhinorrhea, sinus pressure and sore throat.   Eyes:  Negative for pain, redness and visual disturbance.  Respiratory:  Negative for cough, shortness of breath and wheezing.   Cardiovascular:  Negative for chest pain and palpitations.  Gastrointestinal:  Negative for abdominal pain, blood in stool, constipation and diarrhea.  Endocrine: Negative for polydipsia and polyuria.  Genitourinary:  Negative for dysuria, frequency and urgency.  Musculoskeletal:  Negative for arthralgias, back pain and myalgias.       Balance is improved so far with PT   Skin:  Negative for  pallor and rash.  Allergic/Immunologic: Negative for environmental allergies.  Neurological:  Negative for dizziness, syncope and headaches.  Hematological:  Negative for adenopathy. Does not bruise/bleed easily.  Psychiatric/Behavioral:  Negative for decreased concentration and dysphoric mood. The patient is not nervous/anxious.        Objective:   Physical Exam Constitutional:      General: She is not in acute distress.    Appearance: Normal appearance. She is well-developed and normal weight. She is not ill-appearing or diaphoretic.  HENT:     Head: Normocephalic and atraumatic.  Eyes:     Conjunctiva/sclera: Conjunctivae normal.     Pupils: Pupils are equal, round, and reactive to light.  Neck:     Thyroid : No thyromegaly.     Vascular: No carotid bruit or JVD.  Cardiovascular:     Rate and Rhythm: Normal rate and regular rhythm.     Heart sounds: Normal heart sounds.     No gallop.  Pulmonary:     Effort: Pulmonary effort is normal. No respiratory distress.     Breath sounds: Normal breath sounds. No wheezing or rales.  Abdominal:     General: There is no distension or abdominal bruit.     Palpations: Abdomen is soft.  Musculoskeletal:     Cervical back: Normal range of motion and neck supple.     Right lower leg: No edema.     Left lower leg: No edema.  Lymphadenopathy:     Cervical: No cervical adenopathy.  Skin:    General: Skin is warm and dry.     Coloration: Skin is not pale.     Findings: No rash.  Neurological:     Mental Status: She is alert.     Coordination: Coordination normal.     Deep Tendon Reflexes: Reflexes are normal and symmetric. Reflexes normal.  Psychiatric:        Mood and Affect: Mood normal.           Assessment & Plan:   Problem List Items Addressed This Visit       Cardiovascular and Mediastinum   Essential hypertension - Primary   bp in fair control at this time -improved and coming down with increase in lisinopril  to 20 mg   Tolerating well BP Readings from Last 1 Encounters:  09/11/23 138/65   No changes needed Encouraged more water for renal health  GFR 50.22  Most recent labs reviewed  Disc lifstyle change with low sodium diet and exercise   Follow up 3 months  Consider addn of  amlodipine if needed        Relevant Medications   lisinopril  (ZESTRIL ) 20 MG tablet     Genitourinary   Chronic kidney disease (CKD) stage G3a/A1, moderately decreased glomerular filtration rate (GFR) between 45-59 mL/min/1.73 square meter and albuminuria creatinine ratio less than 30 mg/g (HCC)   GFR 50.22 last check  In setting of HTN  BUN was elevated at 24 Encouraged more water intake Also increase lisinopril  to 20 mg   Will follow up 3 mo for re check and blood pressure check

## 2023-09-14 ENCOUNTER — Ambulatory Visit: Admitting: Physical Therapy

## 2023-09-20 ENCOUNTER — Ambulatory Visit: Admitting: Physical Therapy

## 2023-09-22 ENCOUNTER — Ambulatory Visit

## 2023-09-26 ENCOUNTER — Encounter: Admitting: Physical Therapy

## 2023-09-28 ENCOUNTER — Ambulatory Visit: Admitting: Physical Therapy

## 2023-10-25 ENCOUNTER — Other Ambulatory Visit: Payer: Self-pay | Admitting: Family Medicine

## 2023-10-30 DIAGNOSIS — Z0100 Encounter for examination of eyes and vision without abnormal findings: Secondary | ICD-10-CM | POA: Diagnosis not present

## 2023-11-02 ENCOUNTER — Encounter: Payer: Self-pay | Admitting: Dermatology

## 2023-11-02 ENCOUNTER — Ambulatory Visit: Admitting: Dermatology

## 2023-11-02 VITALS — BP 151/85 | HR 66

## 2023-11-02 DIAGNOSIS — L821 Other seborrheic keratosis: Secondary | ICD-10-CM

## 2023-11-02 DIAGNOSIS — Z7189 Other specified counseling: Secondary | ICD-10-CM | POA: Diagnosis not present

## 2023-11-02 DIAGNOSIS — L814 Other melanin hyperpigmentation: Secondary | ICD-10-CM | POA: Diagnosis not present

## 2023-11-02 DIAGNOSIS — D492 Neoplasm of unspecified behavior of bone, soft tissue, and skin: Secondary | ICD-10-CM | POA: Diagnosis not present

## 2023-11-02 DIAGNOSIS — W908XXA Exposure to other nonionizing radiation, initial encounter: Secondary | ICD-10-CM

## 2023-11-02 DIAGNOSIS — D2272 Melanocytic nevi of left lower limb, including hip: Secondary | ICD-10-CM | POA: Diagnosis not present

## 2023-11-02 DIAGNOSIS — D1801 Hemangioma of skin and subcutaneous tissue: Secondary | ICD-10-CM

## 2023-11-02 DIAGNOSIS — L578 Other skin changes due to chronic exposure to nonionizing radiation: Secondary | ICD-10-CM | POA: Diagnosis not present

## 2023-11-02 DIAGNOSIS — D485 Neoplasm of uncertain behavior of skin: Secondary | ICD-10-CM

## 2023-11-02 DIAGNOSIS — L570 Actinic keratosis: Secondary | ICD-10-CM | POA: Diagnosis not present

## 2023-11-02 DIAGNOSIS — D229 Melanocytic nevi, unspecified: Secondary | ICD-10-CM

## 2023-11-02 DIAGNOSIS — Z1283 Encounter for screening for malignant neoplasm of skin: Secondary | ICD-10-CM

## 2023-11-02 MED ORDER — FLUOROURACIL 5 % EX CREA: TOPICAL_CREAM | Freq: Two times a day (BID) | CUTANEOUS | 2 refills | Status: AC

## 2023-11-02 NOTE — Progress Notes (Signed)
 New Patient Visit   Subjective  Caitlin Perez is a 73 y.o. female who presents for the following: Skin Cancer Screening and Full Body Skin Exam. No hx or family hx of skin cancer.  The patient presents for Total-Body Skin Exam (TBSE) for skin cancer screening and mole check. The patient has spots, moles and lesions to be evaluated, some may be new or changing.  The following portions of the chart were reviewed this encounter and updated as appropriate: medications, allergies, medical history  Review of Systems:  No other skin or systemic complaints except as noted in HPI or Assessment and Plan.  Objective  Well appearing patient in no apparent distress; mood and affect are within normal limits.  A full examination was performed including scalp, head, eyes, ears, nose, lips, neck, chest, axillae, abdomen, back, buttocks, bilateral upper extremities, bilateral lower extremities, hands, feet, fingers, toes, fingernails, and toenails. All findings within normal limits unless otherwise noted below.   Relevant physical exam findings are noted in the Assessment and Plan.  Left Forearm - Posterior, Left Temple, Right Forearm - Posterior (2), Right Zygomatic Area Erythematous thin papules/macules with gritty scale.  Left Lower Leg - Anterior 1.1 irregularly shaped brown patch   Assessment & Plan   SKIN CANCER SCREENING PERFORMED TODAY.  ACTINIC DAMAGE - Chronic condition, secondary to cumulative UV/sun exposure - diffuse scaly erythematous macules with underlying dyspigmentation - Recommend daily broad spectrum sunscreen SPF 30+ to sun-exposed areas, reapply every 2 hours as needed.  - Staying in the shade or wearing long sleeves, sun glasses (UVA+UVB protection) and wide brim hats (4-inch brim around the entire circumference of the hat) are also recommended for sun protection.  - Call for new or changing lesions.  LENTIGINES, SEBORRHEIC KERATOSES, HEMANGIOMAS - Benign normal skin  lesions - Benign-appearing - Call for any changes  MELANOCYTIC NEVI - Tan-brown and/or pink-flesh-colored symmetric macules and papules - Benign appearing on exam today - Observation - Call clinic for new or changing moles - Recommend daily use of broad spectrum spf 30+ sunscreen to sun-exposed areas.    ACTINIC KERATOSIS Exam: Erythematous thin papules/macules with gritty scale at the bilateral forearms  Actinic keratoses are precancerous spots that appear secondary to cumulative UV radiation exposure/sun exposure over time. They are chronic with expected duration over 1 year. A portion of actinic keratoses will progress to squamous cell carcinoma of the skin. It is not possible to reliably predict which spots will progress to skin cancer and so treatment is recommended to prevent development of skin cancer.  Recommend daily broad spectrum sunscreen SPF 30+ to sun-exposed areas, reapply every 2 hours as needed.  Recommend staying in the shade or wearing long sleeves, sun glasses (UVA+UVB protection) and wide brim hats (4-inch brim around the entire circumference of the hat). Call for new or changing lesions.  Treatment Plan: Start 5-fluorouracil cream twice a day for 14 days to affected areas including forearms (may complete in the fall).  Reviewed course of treatment and expected reaction.  Patient advised to expect inflammation and crusting and advised that erosions are possible.  Patient advised to be diligent with sun protection during and after treatment. Handout with details of how to apply medication and what to expect provided. Counseled to keep medication out of reach of children and pets.  Reviewed course of treatment and expected reaction.  Patient advised to expect inflammation and crusting and advised that erosions are possible.  Patient advised to be diligent with sun protection  during and after treatment. Handout with details of how to apply medication and what to expect  provided. Counseled to keep medication out of reach of children and pets.     AK (ACTINIC KERATOSIS) (5) Left Forearm - Posterior, Left Temple, Right Forearm - Posterior (2), Right Zygomatic Area Destruction of lesion - Left Temple, Right Forearm - Posterior, Right Zygomatic Area Complexity: simple   Destruction method: cryotherapy   Informed consent: discussed and consent obtained   Timeout:  patient name, date of birth, surgical site, and procedure verified Lesion destroyed using liquid nitrogen: Yes   Region frozen until ice ball extended beyond lesion: Yes   Cryotherapy cycles:  2 Outcome: patient tolerated procedure well with no complications   Post-procedure details: wound care instructions given   Related Medications fluorouracil (EFUDEX) 5 % cream Apply topically 2 (two) times daily for 14 days. Apply 2x daily to bilateral forearms for 14 days NEOPLASM OF UNCERTAIN BEHAVIOR OF SKIN Left Lower Leg - Anterior Skin / nail biopsy Type of biopsy: tangential   Informed consent: discussed and consent obtained   Timeout: patient name, date of birth, surgical site, and procedure verified   Procedure prep:  Patient was prepped and draped in usual sterile fashion Prep type:  Isopropyl alcohol Anesthesia: the lesion was anesthetized in a standard fashion   Anesthetic:  1% lidocaine w/ epinephrine 1-100,000 buffered w/ 8.4% NaHCO3 Instrument used: DermaBlade   Hemostasis achieved with: aluminum chloride   Outcome: patient tolerated procedure well   Post-procedure details: sterile dressing applied and wound care instructions given   Dressing type: petrolatum gauze and bandage   Specimen 1 - Surgical pathology Differential Diagnosis: r/o lentigo vs DN vs MM vs other  Check Margins: No ACTINIC SKIN DAMAGE   LENTIGINES   SEBORRHEIC KERATOSES   CHERRY ANGIOMA   MULTIPLE BENIGN NEVI    Return in about 8 months (around 07/04/2024) for TBSC.  I, Haig Levan, Surg Tech  III, am acting as scribe for Deneise Finlay, MD.   Documentation: I have reviewed the above documentation for accuracy and completeness, and I agree with the above.  Deneise Finlay, MD

## 2023-11-02 NOTE — Patient Instructions (Addendum)
 Important Information  Due to recent changes in healthcare laws, you may see results of your pathology and/or laboratory studies on MyChart before the doctors have had a chance to review them. We understand that in some cases there may be results that are confusing or concerning to you. Please understand that not all results are received at the same time and often the doctors may need to interpret multiple results in order to provide you with the best plan of care or course of treatment. Therefore, we ask that you please give us  2 business days to thoroughly review all your results before contacting the office for clarification. Should we see a critical lab result, you will be contacted sooner.   If You Need Anything After Your Visit  If you have any questions or concerns for your doctor, please call our main line at [phone] If no one answers, please leave a voicemail as directed and we will return your call as soon as possible. Messages left after 4 pm will be answered the following business day.   You may also send us  a message via MyChart. We typically respond to MyChart messages within 1-2 business days.  For prescription refills, please ask your pharmacy to contact our office. Our fax number is [phone].  If you have an urgent issue when the clinic is closed that cannot wait until the next business day, you can page your doctor at the number below.    Please note that while we do our best to be available for urgent issues outside of office hours, we are not available 24/7.   If you have an urgent issue and are unable to reach us , you may choose to seek medical care at your doctor's office, retail clinic, urgent care center, or emergency room.  If you have a medical emergency, please immediately call 911 or go to the emergency department. In the event of inclement weather, please call our main line at [phone] for an update on the status of any delays or  closures.  Dermatology Medication Tips: Please keep the boxes that topical medications come in in order to help keep track of the instructions about where and how to use these. Pharmacies typically print the medication instructions only on the boxes and not directly on the medication tubes.   If your medication is too expensive, please contact our office at [phone] or send us  a message through MyChart.   We are unable to tell what your co-pay for medications will be in advance as this is different depending on your insurance coverage. However, we may be able to find a substitute medication at lower cost or fill out paperwork to get insurance to cover a needed medication.   If a prior authorization is required to get your medication covered by your insurance company, please allow us  1-2 business days to complete this process.  Drug prices often vary depending on where the prescription is filled and some pharmacies may offer cheaper prices.  The website www.goodrx.com contains coupons for medications through different pharmacies. The prices here do not account for what the cost may be with help from insurance (it may be cheaper with your insurance), but the website can give you the price if you did not use any insurance.  - You can print the associated coupon and take it with your prescription to the pharmacy.  - You may also stop by our office during regular business hours and pick up a GoodRx coupon card.  - If  you need your prescription sent electronically to a different pharmacy, notify our office through [facility] or by phone at [phone]    Skin Education :   I counseled the patient regarding the following: Sun screen (SPF 30 or greater) should be applied during peak UV exposure (between 10am and 2pm) and reapplied after exercise or swimming.  The ABCDEs of melanoma were reviewed with the patient, and the importance of monthly self-examination of moles was emphasized.  Should any moles change in shape or color, or itch, bleed or burn, pt will contact our office for evaluation sooner then their interval appointment.  Plan: Sunscreen Recommendations I recommended a broad spectrum sunscreen with a SPF of 30 or higher. I explained that SPF 30 sunscreens block approximately 97 percent of the sun's harmful rays. Sunscreens should be applied at least 15 minutes prior to expected sun exposure and then every 2 hours after that as long as sun exposure continues. If swimming or exercising sunscreen should be reapplied every 45 minutes to an hour after getting wet or sweating. One ounce, or the equivalent of a shot glass full of sunscreen, is adequate to protect the skin not covered by a bathing suit. I also recommended a lip balm with a sunscreen as well. Sun protective clothing can be used in lieu of sunscreen but must be worn the entire time you are exposed to the sun's rays. Liquid nitrogen was applied for 10-12 seconds to the skin lesion and the expected blistering or scabbing reaction explained. Do not pick at the area. Patient reminded to expect hypopigmented scars from the procedure. Return if lesion fails to fully resolve. - Start 5-fluorouracil cream twice a day for 14 days to affected areas including bilateral forearms Patient Handout: Wound Care for Skin Biopsy Site  Taking Care of Your Skin Biopsy Site  Proper care of the biopsy site is essential for promoting healing and minimizing scarring. This handout provides instructions on how to care for your biopsy site to ensure optimal recovery.  1. Cleaning the Wound:  Clean the biopsy site daily with gentle soap and water. Gently pat the area dry with a clean, soft towel. Avoid harsh scrubbing or rubbing the area, as this can irritate the skin and delay healing.  2. Applying Aquaphor and Bandage:  After cleaning the wound, apply a thin layer of Aquaphor ointment to the biopsy site. Cover the area with a sterile  bandage to protect it from dirt, bacteria, and friction. Change the bandage daily or as needed if it becomes soiled or wet.  3. Continued Care for One Week:  Repeat the cleaning, Aquaphor application, and bandaging process daily for one week following the biopsy procedure. Keeping the wound clean and moist during this initial healing period will help prevent infection and promote optimal healing.  4. Massaging Aquaphor into the Area:  ---After one week, discontinue the use of bandages but continue to apply Aquaphor to the biopsy site. ----Gently massage the Aquaphor into the area using circular motions. ---Massaging the skin helps to promote circulation and prevent the formation of scar tissue.   Additional Tips:  Avoid exposing the biopsy site to direct sunlight during the healing process, as this can cause hyperpigmentation or worsen scarring. If you experience any signs of infection, such as increased redness, swelling, warmth, or drainage from the wound, contact your healthcare provider immediately. Follow any additional instructions provided by your healthcare provider for caring for the biopsy site and managing any discomfort. Conclusion:  Taking proper care of your skin biopsy site is crucial for ensuring optimal healing and minimizing scarring. By following these instructions for cleaning, applying Aquaphor, and massaging the area, you can promote a smooth and successful recovery. If you have any questions or concerns about caring for your biopsy site, don't hesitate to contact your healthcare provider for guidance.  .  Reviewed course of treatment and expected reaction.  Patient advised to expect inflammation and crusting and advised that erosions are possible.  Patient advised to be diligent with sun protection during and after treatment. Handout with details of how to apply medication and what to expect provided. Counseled to keep medication out of reach of children and pets.

## 2023-11-07 LAB — SURGICAL PATHOLOGY

## 2023-11-08 ENCOUNTER — Ambulatory Visit: Payer: Self-pay | Admitting: Dermatology

## 2023-11-08 NOTE — Telephone Encounter (Signed)
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

## 2023-11-09 NOTE — Telephone Encounter (Signed)
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

## 2023-11-13 NOTE — Telephone Encounter (Signed)
Lm for pt to call for results

## 2023-11-13 NOTE — Telephone Encounter (Signed)
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

## 2023-11-13 NOTE — Telephone Encounter (Signed)
 Spoke w pt gave her bx results and treatment recommendations

## 2023-12-11 ENCOUNTER — Encounter: Payer: Self-pay | Admitting: Family Medicine

## 2023-12-11 ENCOUNTER — Telehealth: Payer: Self-pay | Admitting: Family Medicine

## 2023-12-11 ENCOUNTER — Ambulatory Visit (INDEPENDENT_AMBULATORY_CARE_PROVIDER_SITE_OTHER): Admitting: Family Medicine

## 2023-12-11 VITALS — BP 135/70 | HR 63 | Temp 97.8°F | Ht 67.0 in | Wt 159.0 lb

## 2023-12-11 DIAGNOSIS — N1831 Chronic kidney disease, stage 3a: Secondary | ICD-10-CM | POA: Diagnosis not present

## 2023-12-11 DIAGNOSIS — I10 Essential (primary) hypertension: Secondary | ICD-10-CM

## 2023-12-11 NOTE — Telephone Encounter (Signed)
 Unable to reach patient. Left voicemail to return call to our office.

## 2023-12-11 NOTE — Progress Notes (Signed)
 Subjective:    Patient ID: Caitlin Perez, female    DOB: 23-Jul-1950, 73 y.o.   MRN: [id number]  HPI  Wt Readings from Last 3 Encounters:  12/11/23 159 lb (72.1 kg)  09/11/23 162 lb 2 oz (73.5 kg)  08/09/23 161 lb 2 oz (73.1 kg)   24.90 kg/m  Vitals:   12/11/23 1421 12/11/23 1439  BP: (!) 152/82 135/70  Pulse: 63   Temp: 97.8 F (36.6 C)   SpO2: 99%     Pt presents for follow up of  HTN  Is a little anxious - her dog is in the hospital     HTN bp is stable today  No cp or palpitations or headaches or edema  No side effects to medicines  BP Readings from Last 3 Encounters:  12/11/23 135/70  11/02/23 (!) 151/85  09/11/23 138/65    Lisinopril  20 mg daily   Pulse Readings from Last 3 Encounters:  12/11/23 63  11/02/23 66  09/11/23 65   Does not monitor blood pressure at home   She feels uneasy when blood pressure is up      Lab Results  Component Value Date   NA 139 08/09/2023   K 4.0 08/09/2023   CO2 27 08/09/2023   GLUCOSE 88 08/09/2023   BUN 24 (H) 08/09/2023   CREATININE 1.10 08/09/2023   CALCIUM 9.2 08/09/2023   GFR 50.22 (L) 08/09/2023   GFRNONAA 61.81 10/26/2009       Patient Active Problem List   Diagnosis Date Noted   Chronic kidney disease (CKD) stage G3a/A1, moderately decreased glomerular filtration rate (GFR) between 45-59 mL/min/1.73 square meter and albuminuria creatinine ratio less than 30 mg/g (HCC) 09/11/2023   Screening mammogram for breast cancer 08/09/2023   Poor balance 08/09/2023   Skin cancer screening 08/09/2023   Elevated glucose 04/15/2022   Osteopenia 04/02/2019   Estrogen deficiency 01/31/2018   Essential hypertension 01/31/2018   Colon cancer screening 10/26/2016   Routine general medical examination at a health care facility 12/25/2012   Encounter for routine gynecological examination 12/25/2012   Hyperlipidemia 08/25/2009   Generalized anxiety disorder 01/27/2009   Past Medical History:  Diagnosis  Date   Anxiety    Other and unspecified hyperlipidemia    Past Surgical History:  Procedure Laterality Date   BREAST BIOPSY Right    BREAST EXCISIONAL BIOPSY Right    COLONOSCOPY  2009   Social History   Tobacco Use   Smoking status: Former   Smokeless tobacco: Never   Tobacco comments:    over 30 years ago  Vaping Use   Vaping status: Never Used  Substance Use Topics   Alcohol use: Yes    Alcohol/week: 0.0 standard drinks of alcohol    Comment: 7 drinks/week   Drug use: No   Family History  Problem Relation Age of Onset   Cancer Mother    Depression Sister    Breast cancer Sister 2   Anxiety disorder Sister    Ovarian cancer Maternal Aunt    Heart disease Other        GP   Stroke Other        GP   Brain cancer Other        Parent  +smoker   Allergies  Allergen Reactions   Codeine    Current Outpatient Medications on File Prior to Visit  Medication Sig Dispense Refill   lisinopril  (ZESTRIL ) 20 MG tablet Take 1 tablet (20 mg total) by mouth  daily. 90 tablet 3   PARoxetine  (PAXIL ) 20 MG tablet TAKE 1 TABLET BY MOUTH EVERY DAY IN THE MORNING 90 tablet 0   VITAMIN D, CHOLECALCIFEROL, PO Take 1 capsule by mouth daily.     No current facility-administered medications on file prior to visit.    Review of Systems  Constitutional:  Negative for activity change, appetite change, fatigue, fever and unexpected weight change.  HENT:  Negative for congestion, ear pain, rhinorrhea, sinus pressure and sore throat.   Eyes:  Negative for pain, redness and visual disturbance.  Respiratory:  Negative for cough, shortness of breath and wheezing.   Cardiovascular:  Negative for chest pain and palpitations.  Gastrointestinal:  Negative for abdominal pain, blood in stool, constipation and diarrhea.  Endocrine: Negative for polydipsia and polyuria.  Genitourinary:  Negative for dysuria, frequency and urgency.  Musculoskeletal:  Negative for arthralgias, back pain and myalgias.   Skin:  Negative for pallor and rash.  Allergic/Immunologic: Negative for environmental allergies.  Neurological:  Negative for dizziness, syncope and headaches.  Hematological:  Negative for adenopathy. Does not bruise/bleed easily.  Psychiatric/Behavioral:  Negative for decreased concentration and dysphoric mood. The patient is nervous/anxious.        Anxious mood today due to a stressor        Objective:   Physical Exam Constitutional:      General: She is not in acute distress.    Appearance: Normal appearance. She is well-developed and normal weight. She is not ill-appearing or diaphoretic.  HENT:     Head: Normocephalic and atraumatic.  Eyes:     Conjunctiva/sclera: Conjunctivae normal.     Pupils: Pupils are equal, round, and reactive to light.  Neck:     Thyroid : No thyromegaly.     Vascular: No carotid bruit or JVD.  Cardiovascular:     Rate and Rhythm: Normal rate and regular rhythm.     Heart sounds: Normal heart sounds.     No gallop.  Pulmonary:     Effort: Pulmonary effort is normal. No respiratory distress.     Breath sounds: Normal breath sounds. No wheezing or rales.  Abdominal:     General: There is no distension or abdominal bruit.     Palpations: Abdomen is soft.  Musculoskeletal:     Cervical back: Normal range of motion and neck supple.     Right lower leg: No edema.     Left lower leg: No edema.  Lymphadenopathy:     Cervical: No cervical adenopathy.  Skin:    General: Skin is warm and dry.     Coloration: Skin is not pale.     Findings: No rash.  Neurological:     Mental Status: She is alert.     Coordination: Coordination normal.     Deep Tendon Reflexes: Reflexes are normal and symmetric. Reflexes normal.  Psychiatric:        Mood and Affect: Mood normal.           Assessment & Plan:   Problem List Items Addressed This Visit       Cardiovascular and Mediastinum   Essential hypertension - Primary   bp in fair control at this time  -improved and coming down with increase in lisinopril  to 20 mg  Tolerating well BP Readings from Last 1 Encounters:  12/11/23 135/70   No changes needed Encouraged more water for renal health  GFR 50.22  Most recent labs reviewed  Disc lifstyle change with low sodium  diet and exercise   Follow up in spring for annual exam  Commended on lifestyle change          Genitourinary   Chronic kidney disease (CKD) stage G3a/A1, moderately decreased glomerular filtration rate (GFR) between 45-59 mL/min/1.73 square meter and albuminuria creatinine ratio less than 30 mg/g (HCC)   Last GFR 50.22  Encouraged good water intake Continue lisinopril  20 mg daily   Will continue to follow   Aiming for good blood pressure control

## 2023-12-11 NOTE — Assessment & Plan Note (Signed)
 Last GFR 50.22  Encouraged good water intake Continue lisinopril  20 mg daily   Will continue to follow   Aiming for good blood pressure control

## 2023-12-11 NOTE — Assessment & Plan Note (Signed)
 bp in fair control at this time -improved and coming down with increase in lisinopril  to 20 mg  Tolerating well BP Readings from Last 1 Encounters:  12/11/23 135/70   No changes needed Encouraged more water for renal health  GFR 50.22  Most recent labs reviewed  Disc lifstyle change with low sodium diet and exercise   Follow up in spring for annual exam  Commended on lifestyle change

## 2023-12-11 NOTE — Telephone Encounter (Signed)
 During checkout, pt asked about what was listed on her AVS for today's visit. AVS stated The following issues were addressed: Essential hypertension and Chronic kidney disease (CKD) stage G3a/A1.... pt states she wasn't aware she has been prev diagnosed with CKD. Pt requested more info from Dr. Randeen regarding diagnoses? Please advise. Call back # [phone]

## 2023-12-11 NOTE — Patient Instructions (Addendum)
 Blood pressure is stable   Continue to avoid excess sodium  Continue to exercise   Continue the lisinopril    Schedule annual exam after 08/08/24

## 2023-12-11 NOTE — Telephone Encounter (Signed)
 Your GFR (kidney filtration) is just short of normal , which is quite common for your age and in the setting of HTN  The chart puts this in a category of chronic kidney disease  No medicines or changes needed for this   We will continue to monitor it  Keep up a good fluid intake and we will continue to treat and monitor blood pressure

## 2023-12-13 NOTE — Telephone Encounter (Unsigned)
 Copied from CRM [id number]. Topic: General - Other >> Dec 13, 2023  9:06 AM Franky GRADE wrote: Reason for CRM: Patient is returning a call she received from Narciso Spanner, advised of the message Dr.Tower left. Patient understood and had no questions.

## 2024-01-18 ENCOUNTER — Encounter: Admitting: Dermatology

## 2024-01-24 ENCOUNTER — Encounter: Payer: Self-pay | Admitting: Dermatology

## 2024-01-26 ENCOUNTER — Other Ambulatory Visit: Payer: Self-pay | Admitting: Family Medicine

## 2024-01-29 ENCOUNTER — Ambulatory Visit (INDEPENDENT_AMBULATORY_CARE_PROVIDER_SITE_OTHER): Admitting: Dermatology

## 2024-01-29 ENCOUNTER — Encounter: Payer: Self-pay | Admitting: Dermatology

## 2024-01-29 DIAGNOSIS — D2272 Melanocytic nevi of left lower limb, including hip: Secondary | ICD-10-CM | POA: Diagnosis not present

## 2024-01-29 DIAGNOSIS — D0372 Melanoma in situ of left lower limb, including hip: Secondary | ICD-10-CM

## 2024-01-29 NOTE — Progress Notes (Signed)
   Follow-Up Visit   Subjective  Caitlin Perez is a 73 y.o. female who presents for the following: consult. 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 Patient is here to discuss WLE vs Mohs for AIMP.   The following portions of the chart were reviewed this encounter and updated as appropriate: medications, allergies, medical history  Review of Systems:  No other skin or systemic complaints except as noted in HPI or Assessment and Plan.  Objective  Well appearing patient in no apparent distress; mood and affect are within normal limits.  A focused examination was performed of the following areas: Left lower leg  Relevant exam findings are noted in the Assessment and Plan.    Assessment & Plan   Atypical Melanocytic Proliferation- Cannot Rule out Lentigo Maligna/MIS on the left lower leg The patient was counseled thoroughly regarding the plan for Mohs micrographic surgery to treat their skin cancer. We discussed the procedure in detail, including the removal of thin layers of tissue for examination under a microscope to ensure complete excision of the cancer while preserving as much healthy tissue as possible. The patient was informed that the surgery may take several hours, with potential additional stages if further tissue removal is necessary to achieve clear margins. We reviewed the risks, which include infection, scarring, and the possibility of requiring reconstructive techniques depending on the location and size of the defect. Reconstruction will occur after the tumor is fully cleared, and depending on the wound size and location, this may involve a flap, graft, linear closure, or sometimes healing by secondary intention. The patient was advised on preoperative and postoperative care, including avoiding certain medications, managing pain, and caring for the surgical site to prevent complications. They were given an opportunity to ask questions, and all concerns were addressed. The patient  expressed understanding and agreed to proceed with the scheduled Mohs surgery.   Return for Mohs surgery.  I, Darice Smock, CMA, am acting as scribe for RUFUS CHRISTELLA HOLY, MD.   Documentation: I have reviewed the above documentation for accuracy and completeness, and I agree with the above.  RUFUS CHRISTELLA HOLY, MD

## 2024-01-29 NOTE — Patient Instructions (Signed)

## 2024-02-02 ENCOUNTER — Encounter: Payer: Self-pay | Admitting: Family Medicine

## 2024-02-02 ENCOUNTER — Ambulatory Visit: Payer: Self-pay | Admitting: *Deleted

## 2024-02-02 ENCOUNTER — Ambulatory Visit (INDEPENDENT_AMBULATORY_CARE_PROVIDER_SITE_OTHER): Admitting: Family Medicine

## 2024-02-02 VITALS — BP 148/60 | HR 69 | Temp 98.4°F | Ht 67.0 in | Wt 163.2 lb

## 2024-02-02 DIAGNOSIS — M545 Low back pain, unspecified: Secondary | ICD-10-CM

## 2024-02-02 DIAGNOSIS — Z23 Encounter for immunization: Secondary | ICD-10-CM | POA: Diagnosis not present

## 2024-02-02 MED ORDER — PREDNISONE 20 MG PO TABS: ORAL_TABLET | ORAL | 0 refills | Status: AC

## 2024-02-02 NOTE — Assessment & Plan Note (Signed)
 Acute, most likely musculoskeletal strain.  No evidence of herniated disc or nerve involvement.  No evidence of kidney injury.  No focal vertebral tenderness/red flag/indication for imaging. Will treat with heat, home physical therapy and prednisone  taper (given chronic kidney disease history recommended stopping ibuprofen) Tylenol extra strength 2 tablets 3 times daily as needed for pain. Offered muscle relaxant at night but patient declined.  Return for reevaluation if pain worsening or not improving over the next 2 to 4 weeks.
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
## 2024-02-02 NOTE — Telephone Encounter (Signed)
 Aware, will watch for correspondence Thanks for seeing her
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
## 2024-02-12 ENCOUNTER — Encounter: Payer: Self-pay | Admitting: Dermatology

## 2024-02-19 ENCOUNTER — Encounter: Payer: Self-pay | Admitting: Dermatology

## 2024-02-19 ENCOUNTER — Ambulatory Visit: Admitting: Dermatology

## 2024-02-19 VITALS — BP 152/82 | HR 60 | Temp 97.8°F

## 2024-02-19 DIAGNOSIS — C439 Malignant melanoma of skin, unspecified: Secondary | ICD-10-CM

## 2024-02-19 DIAGNOSIS — L578 Other skin changes due to chronic exposure to nonionizing radiation: Secondary | ICD-10-CM

## 2024-02-19 DIAGNOSIS — L814 Other melanin hyperpigmentation: Secondary | ICD-10-CM

## 2024-02-19 DIAGNOSIS — D0372 Melanoma in situ of left lower limb, including hip: Secondary | ICD-10-CM

## 2024-02-19 MED ORDER — MUPIROCIN 2 % EX OINT: 1.0000 | TOPICAL_OINTMENT | Freq: Two times a day (BID) | CUTANEOUS | 2 refills | Status: AC

## 2024-02-19 NOTE — Patient Instructions (Signed)

## 2024-02-19 NOTE — Progress Notes (Signed)
 Follow-Up Visit   Subjective  Caitlin Perez is a 73 y.o. female who presents for the following: Mohs of Atypical Junctional Lentiginous Melanocytic Proliferation cannot rule out Melanoma in Situ on the left lower leg anterior, biopsied by Dr. Corey.  The following portions of the chart were reviewed this encounter and updated as appropriate: medications, allergies, medical history  Review of Systems:  No other skin or systemic complaints except as noted in HPI or Assessment and Plan.  Objective  Well appearing patient in no apparent distress; mood and affect are within normal limits.  A focused examination was performed of the following areas: Left lower leg anterior Relevant physical exam findings are noted in the Assessment and Plan.   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 No   Was the anticoagulation regimen changed prior to Mohs? No    Anesthesia: Anesthesia method: local infiltration Local anesthetic: lidocaine 1% WITH epi  Procedure Details: Timeout: pre-procedure verification complete Procedure Prep: patient was prepped and draped in usual sterile fashion Prep type: chlorhexidine Biopsy accession number: [id number] Frozen section biopsy performed: No   Pre-Op diagnosis: melanoma Other pre-op diagnosis: AIMP Melanoma subtype: in situ MohsAIQ Surgical site (if tumor spans multiple areas, please select predominant area): lower limb (including hip) Surgery side: left Surgical site (from skin exam): Left Lower Leg - Anterior Pre-operative length (cm): 1.3 Pre-operative width (cm): 1.5 Indications for Mohs surgery: anatomic location where tissue conservation is critical Previously treated? No     Micrographic Surgery Details: Post-operative length (cm): 2 Post-operative width (cm): 2 Number of Mohs stages: 1 Cumulative additional sections past 5 per stage: 0 Post surgery depth of defect: subcutaneous fat Is this a complex case (associate members only): No    Stage 1    Tumor features identified on Mohs section: no tumor identified    Depth of tumor invasion after stage: subcutaneous fat    Perineural invasion: no perineural invasion  Patient tolerance of procedure: tolerated well, no immediate complications  Reconstruction: Was the defect reconstructed? Yes   Was reconstruction performed by the same Mohs surgeon? Yes   Setting of reconstruction: outpatient office When was reconstruction performed? same day Type of reconstruction: linear  Opioids: Did the patient receive a prescription for opioid/narcotic related to Mohs surgery?: No    Antibiotics: Does patient meet AHA guidelines for endocarditis?: No   Does patient meet AHA guidelines for orthopedic prophylaxis?: No   Were antibiotics given on the day of surgery? Yes   When were antibiotics given? post-operative Did surgery breach mucosa, expose cartilage/bone, involve an area of lymphedema/inflamed/infected tissue? No   Indication for post-operative antibiotics: anatomic location  Skin repair Complexity:  Complex Final length (cm):  6.3 Informed consent: discussed and consent obtained   Timeout: patient name, date of birth, surgical site, and procedure verified   Procedure prep:  Patient was prepped and draped in usual sterile fashion Prep type:  Chlorhexidine Anesthesia: the lesion was anesthetized in a standard fashion   Anesthetic:  1% lidocaine w/ epinephrine 1-100,000 buffered w/ 8.4% NaHCO3 Reason for type of repair: reduce tension to allow closure, preserve normal anatomy, preserve normal anatomical and functional relationships and avoid adjacent structures   Undermining: area extensively undermined    Subcutaneous layers (deep stitches):  Suture  size:  3-0 Suture type: Vicryl (polyglactin 910)   Stitches:  Buried vertical mattress Fine/surface layer approximation (top stitches):  Suture size:  6-0 Suture type: fast-absorbing plain gut   Stitches: simple running   Hemostasis achieved with: suture, pressure and electrodesiccation Outcome: patient tolerated procedure well with no complications   Post-procedure details: sterile dressing applied   Dressing type: petrolatum, bandage and pressure dressing      Return in about 4 weeks (around 03/18/2024) for Mohs f/u.  LILLETTE Berwyn Lesches, Surg Tech III, am acting as scribe for RUFUS CHRISTELLA HOLY, MD.    02/19/2024  HISTORY OF PRESENT ILLNESS  Caitlin Perez is seen in consultation at the request of Dr. HOLY for biopsy-proven Atypical Junctional Lentiginous Melanocytic Proliferation cannot rule out Melanoma in Situ on the left lower leg. They note that the area has been present for about 6 months increasing in size with time.  There is no history of previous treatment.  Reports no other new or changing lesions and has no other complaints today.  Medications and allergies: see patient chart.  Review of systems: Reviewed 8 systems and notable for the above skin cancer.  All other systems reviewed are unremarkable/negative, unless noted in the HPI. Past medical history, surgical history, family history, social history were also reviewed and are noted in the chart/questionnaire.    PHYSICAL EXAMINATION  General: Well-appearing, in no acute distress, alert and oriented x 4. Vitals reviewed in chart (if available).   Skin: Exam reveals a 1.3 x 1.5 cm erythematous papule and biopsy scar on the left lower leg. There are rhytids, telangiectasias, and lentigines, consistent with photodamage.  Biopsy report(s) reviewed, confirming the diagnosis.   ASSESSMENT  1) Atypical Junctional Lentiginous Melanocytic Proliferation cannot rule out Melanoma in  Situ on the left lower leg 2) photodamage 3) solar lentigines   PLAN   1. Due to location, size, histology, or recurrence and the likelihood of subclinical extension as well as the need to conserve normal surrounding tissue, the patient was deemed acceptable for Mohs micrographic surgery (MMS).  The nature and purpose of the procedure, associated benefits and risks including recurrence and scarring, possible complications such as pain, infection, and bleeding, and alternative methods of treatment if appropriate were discussed with the patient during consent. The lesion location was verified by the patient, by reviewing previous notes, pathology reports, and by photographs as well as angulation measurements if available.  Informed consent was reviewed and signed by the patient, and timeout was performed at 8:15 AM. See op note below.  2. For the photodamage and solar lentigines, sun protection discussed/information given on OTC sunscreens, and we recommend continued regular follow-up with primary dermatologist every 6 months or sooner for any growing, bleeding, or changing lesions. 3. Prognosis and future surveillance discussed. 4. Letter with treatment outcome sent to referring provider. 5. Pain acetaminophen/ibuprofen   MOHS MICROGRAPHIC SURGERY AND RECONSTRUCTION  Initial size:   1.3 x 1.5 cm Surgical defect/wound size: 2.0 x 2.0 cm Anesthesia:    0.33% lidocaine with 1:200,000 epinephrine EBL:    <5 mL Complications:  None Repair type:   Complex SQ suture:   3-0 Vicryl Cutaneous suture:  6-0 Plain gut Final size of the repair: 6.3 cm  Stages:  1  STAGE I: Anesthesia achieved with 0.5% lidocaine with 1:200,000 epinephrine. ChloraPrep applied. 5 section(s) excised using Mohs technique (this includes total peripheral and deep tissue margin excision and evaluation with frozen sections, excised and interpreted by the same physician).  The tumor was first debulked and then excised with an  approx. 2mm margin.  Hemostasis was achieved with electrocautery as needed.  The specimen was then oriented, subdivided/relaxed, inked, and processed using Mohs technique.  Tissue was stained with H&E and MART-1 with 2 chromogens (2 immunostains).  Frozen section analysis revealed a clear deep and peripheral margin.   Reconstruction  The surgical wound was then cleaned, prepped, and re-anesthetized as above. Wound edges were undermined extensively along at least one entire edge and at a distance equal to or greater than the width of the defect (see wound defect size above) in order to achieve closure and decrease wound tension and anatomic distortion. Redundant tissue repair including standing cone removal was performed. Hemostasis was achieved with electrocautery. Subcutaneous and epidermal tissues were approximated with the above sutures. The surgical site was then lightly scrubbed with sterile, saline-soaked gauze. The area was then bandaged using Vaseline ointment, non-adherent gauze, gauze pads, and tape to provide an adequate pressure dressing. The patient tolerated the procedure well, was given detailed written and verbal wound care instructions, and was discharged in good condition.   The patient will follow-up: 4 weeks.    Documentation: I have reviewed the above documentation for accuracy and completeness, and I agree with the above.  RUFUS CHRISTELLA HOLY, MD

## 2024-03-04 ENCOUNTER — Encounter: Payer: Self-pay | Admitting: Dermatology

## 2024-03-19 ENCOUNTER — Ambulatory Visit: Admitting: Dermatology

## 2024-04-29 ENCOUNTER — Ambulatory Visit

## 2024-04-29 ENCOUNTER — Other Ambulatory Visit

## 2024-05-03 NOTE — Progress Notes (Signed)
 Caitlin Perez                                          MRN: [id number]   05/03/2024   The [facility] Quality Team Specialist reviewed this patient medical record for the purposes of chart review for care gap closure. The following were reviewed: chart review for care gap closure-controlling blood pressure.    [facility] Quality Team

## 2024-05-23 ENCOUNTER — Ambulatory Visit
Admission: RE | Admit: 2024-05-23 | Discharge: 2024-05-23 | Disposition: A | Discharge status: Home / Self Care | Source: Ambulatory Visit | Attending: Family Medicine | Admitting: Family Medicine

## 2024-05-23 DIAGNOSIS — Z1231 Encounter for screening mammogram for malignant neoplasm of breast: Secondary | ICD-10-CM

## 2024-05-28 ENCOUNTER — Ambulatory Visit: Payer: Self-pay | Admitting: Family Medicine

## 2024-07-04 ENCOUNTER — Ambulatory Visit: Admitting: Dermatology

## 2024-08-07 ENCOUNTER — Other Ambulatory Visit

## 2024-08-14 ENCOUNTER — Encounter: Admitting: Family Medicine
